# Patient Record
Sex: Male | Born: 1945
Health system: Southern US, Community
[De-identification: ages and names within clinical notes are randomized; demographics above are authoritative.]

## PROBLEM LIST (undated history)

## (undated) DIAGNOSIS — Z87442 Personal history of urinary calculi: Secondary | ICD-10-CM

## (undated) DIAGNOSIS — T7840XA Allergy, unspecified, initial encounter: Secondary | ICD-10-CM

## (undated) HISTORY — DX: Allergy, unspecified, initial encounter: T78.40XA

## (undated) HISTORY — PX: COLONOSCOPY: SHX174

## (undated) HISTORY — PX: JOINT REPLACEMENT: SHX530

## (undated) HISTORY — PX: FINGER SURGERY: SHX640

## (undated) HISTORY — PX: TONSILLECTOMY: SUR1361

---

## 2011-05-30 ENCOUNTER — Telehealth: Payer: Self-pay

## 2011-05-30 NOTE — Telephone Encounter (Signed)
.  [facility] PT WOULD LIKE TO HAVE A REFILL ON HIS GENERIC LIPITOR TO [facility] PLEASE CALL PT AT [phone]    [facility]

## 2011-05-31 MED ORDER — ATORVASTATIN CALCIUM 10 MG PO TABS: 10.0000 mg | ORAL_TABLET | Freq: Every day | ORAL | Status: DC

## 2011-05-31 NOTE — Telephone Encounter (Signed)
Patient notified

## 2011-05-31 NOTE — Telephone Encounter (Signed)
Sent into [facility]  

## 2011-06-05 ENCOUNTER — Other Ambulatory Visit: Payer: Self-pay | Admitting: *Deleted

## 2011-06-05 MED ORDER — ATORVASTATIN CALCIUM 10 MG PO TABS: 10.0000 mg | ORAL_TABLET | Freq: Every day | ORAL | Status: DC

## 2011-06-13 ENCOUNTER — Other Ambulatory Visit: Payer: Self-pay

## 2011-06-13 MED ORDER — ATORVASTATIN CALCIUM 10 MG PO TABS: 10.0000 mg | ORAL_TABLET | Freq: Every day | ORAL | Status: DC

## 2011-11-21 ENCOUNTER — Telehealth: Payer: Self-pay

## 2011-11-21 NOTE — Telephone Encounter (Signed)
Yes I have found it and advised patient I will make sure Dr Neva Seat has it for his appt tomorrow.

## 2011-11-21 NOTE — Telephone Encounter (Signed)
PT CALLED HE HAS AN APPT WITH DR Neva Seat TOMORROW AND WANTS TO MAKE SURE [facility] HAS SENT OVER MEDICAL RECORDS FOR DR Neva Seat TO REVIEW

## 2011-11-22 ENCOUNTER — Ambulatory Visit (INDEPENDENT_AMBULATORY_CARE_PROVIDER_SITE_OTHER): Payer: BC Managed Care – PPO | Admitting: Family Medicine

## 2011-11-22 ENCOUNTER — Encounter: Payer: Self-pay | Admitting: Family Medicine

## 2011-11-22 VITALS — BP 132/80 | HR 76 | Temp 98.1°F | Resp 18 | Ht 69.0 in | Wt 167.0 lb

## 2011-11-22 DIAGNOSIS — Z0000 Encounter for general adult medical examination without abnormal findings: Secondary | ICD-10-CM

## 2011-11-22 DIAGNOSIS — Z8249 Family history of ischemic heart disease and other diseases of the circulatory system: Secondary | ICD-10-CM

## 2011-11-22 DIAGNOSIS — Z23 Encounter for immunization: Secondary | ICD-10-CM

## 2011-11-22 DIAGNOSIS — E785 Hyperlipidemia, unspecified: Secondary | ICD-10-CM

## 2011-11-22 LAB — CBC
HCT: 47.6 % (ref 39.0–52.0)
Hemoglobin: 16.6 g/dL (ref 13.0–17.0)
MCH: 31.7 pg (ref 26.0–34.0)
MCHC: 34.9 g/dL (ref 30.0–36.0)
MCV: 91 fL (ref 78.0–100.0)
Platelets: 261 10*3/uL (ref 150–400)
RBC: 5.23 MIL/uL (ref 4.22–5.81)
RDW: 13.5 % (ref 11.5–15.5)
WBC: 6.6 10*3/uL (ref 4.0–10.5)

## 2011-11-22 MED ORDER — TETANUS-DIPHTH-ACELL PERTUSSIS 5-2.5-18.5 LF-MCG/0.5 IM SUSP: 0.5000 mL | Freq: Once | INTRAMUSCULAR | Status: DC

## 2011-11-22 MED ORDER — ATORVASTATIN CALCIUM 10 MG PO TABS: 10.0000 mg | ORAL_TABLET | Freq: Every day | ORAL | Status: DC

## 2011-11-22 MED ORDER — PNEUMOCOCCAL VAC POLYVALENT 25 MCG/0.5ML IJ INJ: 0.5000 mL | INJECTION | INTRAMUSCULAR | Status: AC

## 2011-11-22 NOTE — Progress Notes (Signed)
Subjective:    Patient ID: Jesse Riddle, male    DOB: [birth year], 66 y.o.   MRN: [id number]  HPI Jesse Riddle is a 66 y.o. male Here for cpe.   Mom and dad passed in 4's.  6 siblings - brother in mid 63's with heart disease - MI (but was smoker). Biking 25-35 miles per weekend, without any chest pain.   Hyperlipidemia - 11/12 labs: TC 148, HDL 46, LDL 87. LFT's WNL. Few missed doses - but usually takes.   Borderline GFR - 74 on 02/08/11 (cr 1.05).   Hx elevated PSA- followed by [facility].  PSA 2.0 06/03/11 at [facility]. May office visit with Dr. Margarita Grizzle - stable for 5 years.  Plan now to follow up PSA here yearly. Had urinalysis earlier this year.   Last Td in approx. 2005. No hx of pneumovax or Zostavax.   Review of Systems  Constitutional: Negative for fatigue and unexpected weight change.  Eyes: Negative for visual disturbance.  Respiratory: Negative for cough, chest tightness and shortness of breath.   Cardiovascular: Negative for chest pain, palpitations and leg swelling.  Gastrointestinal: Negative for abdominal pain and blood in stool.  Neurological: Negative for dizziness, light-headedness and headaches.  few spots on face - appt with dermatologist - in next 1 month.  No known hx of skin ca.  Few red spots noted.  No other concerns on 13 point ROS on PHS.     Objective:   Physical Exam  Constitutional: He is oriented to person, place, and time. He appears well-developed and well-nourished.  HENT:  Head: Normocephalic and atraumatic.  Right Ear: External ear normal.  Left Ear: External ear normal.  Mouth/Throat: Oropharynx is clear and moist.  Eyes: Conjunctivae and EOM are normal. Pupils are equal, round, and reactive to light.  Neck: Normal range of motion. Neck supple. No thyromegaly present.  Cardiovascular: Normal rate, regular rhythm, normal heart sounds and intact distal pulses.   Pulmonary/Chest: Effort normal and breath sounds normal. No respiratory  distress. He has no wheezes.  Abdominal: Soft. He exhibits no distension. There is no tenderness. Hernia confirmed negative in the right inguinal area and confirmed negative in the left inguinal area.  Musculoskeletal: Normal range of motion. He exhibits no edema and no tenderness.  Lymphadenopathy:    He has no cervical adenopathy.  Neurological: He is alert and oriented to person, place, and time. He has normal reflexes.  Skin: Skin is warm and dry.  Psychiatric: He has a normal mood and affect. His behavior is normal.       Assessment & Plan:  Jesse Riddle is a 66 y.o. male 1. Annual physical exam  CBC, Comprehensive metabolic panel, EKG 12-Lead, Lipid panel, TDaP (BOOSTRIX) injection 0.5 mL, pneumococcal 23 valent vaccine (PNU-IMMUNE) injection 0.5 mL  2. Hyperlipidemia  Comprehensive metabolic panel, EKG 12-Lead, Lipid panel, atorvastatin (LIPITOR) 10 MG tablet  3. Family history of coronary artery disease  EKG 12-Lead  4. Need for prophylactic vaccination against Streptococcus pneumoniae (pneumococcus)  pneumococcal 23 valent vaccine (PNU-IMMUNE) injection 0.5 mL  5. Need for prophylactic vaccination with combined diphtheria-tetanus-pertussis (DTP) vaccine  TDaP (BOOSTRIX) injection 0.5 mL    CPE -  Discussed stress testing/cards eval with FH of CAD, age and hyperlipidemia.  He would like to think about this referral - deferred at this point.  Tdap updated, pneumovax given and paper rx for zostavax - for [facility].    Hx of elevated PSA - last reading 2.0  in April - plan on once yearly, then if increasing can refer back to urology.    Hyperlipidemia - prior ok.  Cont fish oil, baby ASA, and current dose of lipitor.

## 2011-11-22 NOTE — Patient Instructions (Addendum)
Your should receive a call or letter about your lab results within the next week to 10 days.  Check into your insurance coverage about stress testing.

## 2011-11-23 LAB — COMPREHENSIVE METABOLIC PANEL
Alkaline Phosphatase: 56 U/L (ref 39–117)
BUN: 10 mg/dL (ref 6–23)
CO2: 24 mEq/L (ref 19–32)
Glucose, Bld: 85 mg/dL (ref 70–99)
Total Bilirubin: 0.7 mg/dL (ref 0.3–1.2)

## 2011-11-23 LAB — LIPID PANEL
Cholesterol: 137 mg/dL (ref 0–200)
HDL: 44 mg/dL (ref >39)
LDL Cholesterol: 79 mg/dL (ref 0–99)
Triglycerides: 71 mg/dL (ref <150)
VLDL: 14 mg/dL (ref 0–40)

## 2011-12-03 ENCOUNTER — Telehealth: Payer: Self-pay

## 2011-12-03 NOTE — Telephone Encounter (Signed)
Patient would like a copy of his lab results from his visit on 11/22/11 mailed to him.

## 2011-12-03 NOTE — Telephone Encounter (Signed)
Mailed to him.  

## 2012-11-23 ENCOUNTER — Other Ambulatory Visit: Payer: Self-pay | Admitting: Family Medicine

## 2012-11-23 DIAGNOSIS — E785 Hyperlipidemia, unspecified: Secondary | ICD-10-CM

## 2012-11-23 MED ORDER — ATORVASTATIN CALCIUM 10 MG PO TABS: 10.0000 mg | ORAL_TABLET | Freq: Every day | ORAL | Status: DC

## 2012-11-23 NOTE — Progress Notes (Signed)
Wife in office. Advised he has appt on 9/22. Ran out of meds few weeks. 90 day supply written.

## 2012-12-21 ENCOUNTER — Ambulatory Visit (INDEPENDENT_AMBULATORY_CARE_PROVIDER_SITE_OTHER): Payer: BC Managed Care – PPO | Admitting: Family Medicine

## 2012-12-21 ENCOUNTER — Encounter: Payer: Self-pay | Admitting: Family Medicine

## 2012-12-21 VITALS — BP 130/82 | HR 68 | Temp 98.0°F | Resp 16 | Ht 69.0 in | Wt 167.4 lb

## 2012-12-21 DIAGNOSIS — Z0000 Encounter for general adult medical examination without abnormal findings: Secondary | ICD-10-CM

## 2012-12-21 DIAGNOSIS — L719 Rosacea, unspecified: Secondary | ICD-10-CM

## 2012-12-21 DIAGNOSIS — Z23 Encounter for immunization: Secondary | ICD-10-CM

## 2012-12-21 DIAGNOSIS — Z87898 Personal history of other specified conditions: Secondary | ICD-10-CM

## 2012-12-21 DIAGNOSIS — E785 Hyperlipidemia, unspecified: Secondary | ICD-10-CM

## 2012-12-21 LAB — CBC WITH DIFFERENTIAL/PLATELET
Basophils Absolute: 0.1 10*3/uL (ref 0.0–0.1)
Basophils Relative: 1 % (ref 0–1)
Eosinophils Relative: 2 % (ref 0–5)
Lymphocytes Relative: 30 % (ref 12–46)
MCHC: 35.3 g/dL (ref 30.0–36.0)
MCV: 89.8 fL (ref 78.0–100.0)
Platelets: 270 10*3/uL (ref 150–400)
RDW: 13.4 % (ref 11.5–15.5)
WBC: 7.1 10*3/uL (ref 4.0–10.5)

## 2012-12-21 LAB — POCT URINALYSIS DIPSTICK
Leukocytes, UA: NEGATIVE
Nitrite, UA: NEGATIVE
Protein, UA: NEGATIVE
pH, UA: 5.5

## 2012-12-21 LAB — POCT UA - MICROSCOPIC ONLY
Crystals, Ur, HPF, POC: NEGATIVE
Yeast, UA: NEGATIVE

## 2012-12-21 MED ORDER — ZOSTER VACCINE LIVE 19400 UNT/0.65ML ~~LOC~~ SOLR: 0.6500 mL | Freq: Once | SUBCUTANEOUS | Status: DC

## 2012-12-21 MED ORDER — ATORVASTATIN CALCIUM 10 MG PO TABS: 10.0000 mg | ORAL_TABLET | Freq: Every day | ORAL | Status: DC

## 2012-12-21 NOTE — Patient Instructions (Signed)
You should receive a call or letter about your lab results within the next week to 10 days.  To look up more info on your condition, go to the website [email or web address], then on patient resources - select [facility]. Under patient resources, select rosacea Rosacea Rosacea is a long-term (chronic) condition that affects the skin of the face (cheeks, nose, brow, and chin) and sometimes the eyes. Rosacea causes the blood vessels near the surface of the skin to enlarge, resulting in redness. This condition usually begins after age 51. It occurs most often in light-skinned women. Without treatment, rosacea tends to get worse over time. There is no cure for rosacea, but treatment can help control your symptoms. CAUSES  The cause is unknown. It is thought that some people may inherit a tendency to develop rosacea. Certain triggers can make your rosacea worse, including:  Hot baths.  Exercise.  Sunlight.  Very hot or cold temperatures.  Hot or spicy foods and drinks.  Drinking alcohol.  Stress.  Taking blood pressure medicine.  Long-term use of topical steroids on the face. SYMPTOMS   Redness of the face.  Red bumps or pimples on the face.  Red, enlarged nose (rhinophyma).  Blushing easily.  Red lines on the skin.  Irritated or burning feeling in the eyes.  Swollen eyelids. DIAGNOSIS  Your caregiver can usually tell what is wrong by asking about your symptoms and performing a physical exam. TREATMENT  Avoiding triggers is an important part of treatment. You will also need to see a skin specialist (dermatologist) who can develop a treatment plan for you. The goals of treatment are to control your condition and to improve the appearance of your skin. It may take several weeks or months of treatment before you notice an improvement in your skin. Even after your skin improves, you will likely need to continue treatment to prevent your rosacea from coming back. Treatment methods may  include:  Using sunscreen or sunblock daily to protect the skin.  Antibiotic medicine, such as metronidazole, applied directly to the skin.  Antibiotics taken by mouth. This is usually prescribed if you have eye problems from your rosacea.  Laser surgery to improve the appearance of the skin. This surgery can reduce the appearance of red lines on the skin and can remove excess tissue from the nose to reduce its size. HOME CARE INSTRUCTIONS  Avoid things that seem to trigger your flare-ups.  If you are given antibiotics, take them as directed. Finish them even if you start to feel better.  Use a gentle facial cleanser that does not contain alcohol.  You may use a mild facial moisturizer.  Use a sunscreen or sunblock with SPF 30 or greater.  Wear a green-tinted foundation powder to conceal redness, if needed. Choose cosmetics that are noncomedogenic. This means they do not block your pores.  If your eyelids are affected, apply warm compresses to the eyelids. Do this up to 4 times a day or as directed by your caregiver. SEEK MEDICAL CARE IF:  Your skin problems get worse.  You feel depressed.  You lose your appetite.  You have trouble concentrating.  You have problems with your eyes, such as redness or itching. MAKE SURE YOU:  Understand these instructions.  Will watch your condition.  Will get help right away if you are not doing well or get worse. Document Released: 04/25/2004 Document Revised: 09/17/2011 Document Reviewed: 02/26/2011 [facility] Patient Information 2014 [place], [place].  Keeping you healthy  Get these tests  Blood pressure- Have your blood pressure checked once a year by your healthcare provider.  Normal blood pressure is 120/80  Weight- Have your body mass index (BMI) calculated to screen for obesity.  BMI is a measure of body fat based on height and weight. You can also calculate your own BMI at [email or web address].  Cholesterol- Have your  cholesterol checked every year.  Diabetes- Have your blood sugar checked regularly if you have high blood pressure, high cholesterol, have a family history of diabetes or if you are overweight.  Screening for Colon Cancer- Colonoscopy starting at age 26.  Screening may begin sooner depending on your family history and other health conditions. Follow up colonoscopy as directed by your Gastroenterologist.  Screening for Prostate Cancer- Both blood work (PSA) and a rectal exam help screen for Prostate Cancer.  Screening begins at age 74 with African-American men and at age 55 with Caucasian men.  Screening may begin sooner depending on your family history.  Take these medicines  Aspirin- One aspirin daily can help prevent Heart disease and Stroke.  Flu shot- Every fall.  Tetanus- Every 10 years.  Zostavax- Once after the age of 46 to prevent Shingles.  Pneumonia shot- Once after the age of 20; if you are younger than 87, ask your healthcare provider if you need a Pneumonia shot.  Take these steps  Don't smoke- If you do smoke, talk to your doctor about quitting.  For tips on how to quit, go to www.smokefree.gov or call 1-800-QUIT-NOW.  Be physically active- Exercise 5 days a week for at least 30 minutes.  If you are not already physically active start slow and gradually work up to 30 minutes of moderate physical activity.  Examples of moderate activity include walking briskly, mowing the yard, dancing, swimming, bicycling, etc.  Eat a healthy diet- Eat a variety of healthy food such as fruits, vegetables, low fat milk, low fat cheese, yogurt, lean meant, poultry, fish, beans, tofu, etc. For more information go to www.thenutritionsource.org  Drink alcohol in moderation- Limit alcohol intake to less than two drinks a day. Never drink and drive.  Dentist- Brush and floss twice daily; visit your dentist twice a year.  Depression- Your emotional health is as important as your physical health.  If you're feeling down, or losing interest in things you would normally enjoy please talk to your healthcare provider.  Eye exam- Visit your eye doctor every year.  Safe sex- If you may be exposed to a sexually transmitted infection, use a condom.  Seat belts- Seat belts can save your life; always wear one.  Smoke/Carbon Monoxide detectors- These detectors need to be installed on the appropriate level of your home.  Replace batteries at least once a year.  Skin cancer- When out in the sun, cover up and use sunscreen 15 SPF or higher.  Violence- If anyone is threatening you, please tell your healthcare provider.  Living Will/ Health care power of attorney- Speak with your healthcare provider and family.

## 2012-12-21 NOTE — Progress Notes (Signed)
Subjective:    Patient ID: Jesse Riddle, male    DOB: October 05, 1945, 67 y.o.   MRN: [id number]  HPI Chrsitopher Riddle is a 67 y.o. male Here for annual exam.  Colonoscopy: 2009 - repeat in 10 years. Tdap 10/2011.  PSA: as below Dentist: Optho: last seen in past 6 months, hx of cataract surgery with lens in R eye few years ago. Early cataract on L.  Advanced directives. Has living will, and has discussed with children. . Full code.  Pneumovax: 10/2011 Zostavax: Rx given 10/2011. Needs another rx today.  Flu - plans on having done at time of zostavax at [facility].  EKG: 10/2011 - SR,  RSR' in V1, but stable.   No acute concerns today.   Hyperlipidemia - on Lipitor 10mg  qd. Fish oil, ASA 81mg  qd.  Results for orders placed in visit on 11/22/11  CBC      Result Value Range   WBC 6.6  4.0 - 10.5 K/uL   RBC 5.23  4.22 - 5.81 MIL/uL   Hemoglobin 16.6  13.0 - 17.0 g/dL   HCT 30.8  65.7 - 84.6 %   MCV 91.0  78.0 - 100.0 fL   MCH 31.7  26.0 - 34.0 pg   MCHC 34.9  30.0 - 36.0 g/dL   RDW 96.2  95.2 - 84.1 %   Platelets 261  150 - 400 K/uL  COMPREHENSIVE METABOLIC PANEL      Result Value Range   Sodium 139  135 - 145 mEq/L   Potassium 4.2  3.5 - 5.3 mEq/L   Chloride 106  96 - 112 mEq/L   CO2 24  19 - 32 mEq/L   Glucose, Bld 85  70 - 99 mg/dL   BUN 10  6 - 23 mg/dL   Creat 3.24  4.01 - 0.27 mg/dL   Total Bilirubin 0.7  0.3 - 1.2 mg/dL   Alkaline Phosphatase 56  39 - 117 U/L   AST 27  0 - 37 U/L   ALT 18  0 - 53 U/L   Total Protein 6.4  6.0 - 8.3 g/dL   Albumin 3.9  3.5 - 5.2 g/dL   Calcium 9.0  8.4 - 25.3 mg/dL  LIPID PANEL      Result Value Range   Cholesterol 137  0 - 200 mg/dL   Triglycerides 71  <664 mg/dL   HDL 44  >40 mg/dL   Total CHOL/HDL Ratio 3.1     VLDL 14  0 - 40 mg/dL   LDL Cholesterol 79  0 - 99 mg/dL   Mom and dad passed in 7's.  6 siblings - brother in mid 53's with heart disease - MI (but was smoker and drinker), brother passed in March of this year with cancer in bone of  some type. Bikes up to 15-20 miles per weekend, without any chest pain, or new dyspnea/fatigue.  Discussed stress testing/cards eval with FH of CAD, age and hyperlipidemia at 10/2011 physical. Deferred at that point.   Hx elevated PSA- followed by [facility].  PSA 2.0 06/03/11 at [facility]. May 2013 office visit with Dr. Margarita Grizzle - stable for 5 years.  Plan to follow up PSA here yearly. No new urinary sx's.  Nocturia up to 3 times - stable for years. No hematuria. Min hesitancy, slight weak flow - but stable.    Review of Systems 13 point review of systems per patient health survey noted.  Negative other than as indicated on reviewed  nursing note.      Objective:   Physical Exam  Vitals reviewed. Constitutional: He is oriented to person, place, and time. He appears well-developed and well-nourished.  HENT:  Head: Normocephalic and atraumatic.  Right Ear: External ear normal.  Left Ear: External ear normal.  Mouth/Throat: Oropharynx is clear and moist.  Eyes: Conjunctivae and EOM are normal. Pupils are equal, round, and reactive to light.  Neck: Normal range of motion. Neck supple. No thyromegaly present.  Cardiovascular: Normal rate, regular rhythm, normal heart sounds and intact distal pulses.   Pulmonary/Chest: Effort normal and breath sounds normal. No respiratory distress. He has no wheezes.  Abdominal: Soft. He exhibits no distension. There is no tenderness. Hernia confirmed negative in the right inguinal area and confirmed negative in the left inguinal area.  Genitourinary: Prostate is enlarged (slight enlargement without apparent focal nodularity. ). Prostate is not tender.  Musculoskeletal: Normal range of motion. He exhibits no edema and no tenderness.  Lymphadenopathy:    He has no cervical adenopathy.  Neurological: He is alert and oriented to person, place, and time. He has normal reflexes.  Skin: Skin is warm and dry.     Psychiatric: He has a normal mood and  affect. His behavior is normal.   Filed Vitals:   12/21/12 1312  BP: 130/82  Pulse: 68  Temp: 98 F (36.7 C)  TempSrc: Oral  Resp: 16  Height: 5\' 9"  (1.753 m)  Weight: 167 lb 6.4 oz (75.932 kg)  SpO2: 95%   Vision noted: 20/25OD, 20/50 OS, 20/25 OU.  Results for orders placed in visit on 12/21/12  IFOBT (OCCULT BLOOD)      Result Value Range   IFOBT Negative    POCT UA - MICROSCOPIC ONLY      Result Value Range   WBC, Ur, HPF, POC 0-2     RBC, urine, microscopic 0-2     Bacteria, U Microscopic neg     Mucus, UA large     Epithelial cells, urine per micros 0-3     Crystals, Ur, HPF, POC neg     Casts, Ur, LPF, POC neg     Yeast, UA neg    POCT URINALYSIS DIPSTICK      Result Value Range   Color, UA yellow     Clarity, UA clear     Glucose, UA neg     Bilirubin, UA neg     Ketones, UA neg     Spec Grav, UA 1.025     Blood, UA neg     pH, UA 5.5     Protein, UA neg     Urobilinogen, UA 0.2     Nitrite, UA neg     Leukocytes, UA Negative         Assessment & Plan:  Jesse Riddle is a 67 y.o. male Annual physical exam - Plan: IFOBT POC (occult bld, rslt in office), POCT UA - Microscopic Only, POCT urinalysis dipstick, Comprehensive metabolic panel, CBC with Differential  History of elevated PSA - Plan: PSA  Rosacea  Hyperlipidemia - Plan: atorvastatin (LIPITOR) 10 MG tablet, Comprehensive metabolic panel, Lipid panel  Need for shingles vaccine - Plan: zoster vaccine live, PF, (ZOSTAVAX) 78295 UNT/0.65ML injection CPE - up to date on routine care as above. Plans on zostavax and flu vaccines at pharmacy.   Hx of elevated PSA - suspect BPH. recheck PSA today.   Rosacea - Did note reddened area/dry skin L face at end of visit.  Hx of rosacea in past. current sx's for week or two.  Trigger avoidance discussed, h/o given. sunblock, hydrating lotion, and if not improved in next few weeks - RTC to discuss other treatments.  Hyperlipidemia - prior controlled. Labs  pending.  Refilled lipitor.   FH of MI, other cardiac RF's of hyperlipidemia and age, but declined cards eval at present.  Plans on discussing this again next year. RTC/er precautions.   Meds ordered this encounter  Medications  . zoster vaccine live, PF, (ZOSTAVAX) 13086 UNT/0.65ML injection    Sig: Inject 19,400 Units into the skin once.    Dispense:  1 each    Refill:  0  . atorvastatin (LIPITOR) 10 MG tablet    Sig: Take 1 tablet (10 mg total) by mouth daily.    Dispense:  90 tablet    Refill:  2   Patient Instructions  You should receive a call or letter about your lab results within the next week to 10 days.  To look up more info on your condition, go to the website [email or web address], then on patient resources - select [facility]. Under patient resources, select rosacea Rosacea Rosacea is a long-term (chronic) condition that affects the skin of the face (cheeks, nose, brow, and chin) and sometimes the eyes. Rosacea causes the blood vessels near the surface of the skin to enlarge, resulting in redness. This condition usually begins after age 19. It occurs most often in light-skinned women. Without treatment, rosacea tends to get worse over time. There is no cure for rosacea, but treatment can help control your symptoms. CAUSES  The cause is unknown. It is thought that some people may inherit a tendency to develop rosacea. Certain triggers can make your rosacea worse, including:  Hot baths.  Exercise.  Sunlight.  Very hot or cold temperatures.  Hot or spicy foods and drinks.  Drinking alcohol.  Stress.  Taking blood pressure medicine.  Long-term use of topical steroids on the face. SYMPTOMS   Redness of the face.  Red bumps or pimples on the face.  Red, enlarged nose (rhinophyma).  Blushing easily.  Red lines on the skin.  Irritated or burning feeling in the eyes.  Swollen eyelids. DIAGNOSIS  Your caregiver can usually tell what is wrong by asking about your  symptoms and performing a physical exam. TREATMENT  Avoiding triggers is an important part of treatment. You will also need to see a skin specialist (dermatologist) who can develop a treatment plan for you. The goals of treatment are to control your condition and to improve the appearance of your skin. It may take several weeks or months of treatment before you notice an improvement in your skin. Even after your skin improves, you will likely need to continue treatment to prevent your rosacea from coming back. Treatment methods may include:  Using sunscreen or sunblock daily to protect the skin.  Antibiotic medicine, such as metronidazole, applied directly to the skin.  Antibiotics taken by mouth. This is usually prescribed if you have eye problems from your rosacea.  Laser surgery to improve the appearance of the skin. This surgery can reduce the appearance of red lines on the skin and can remove excess tissue from the nose to reduce its size. HOME CARE INSTRUCTIONS  Avoid things that seem to trigger your flare-ups.  If you are given antibiotics, take them as directed. Finish them even if you start to feel better.  Use a gentle facial cleanser that does not contain alcohol.  You  may use a mild facial moisturizer.  Use a sunscreen or sunblock with SPF 30 or greater.  Wear a green-tinted foundation powder to conceal redness, if needed. Choose cosmetics that are noncomedogenic. This means they do not block your pores.  If your eyelids are affected, apply warm compresses to the eyelids. Do this up to 4 times a day or as directed by your caregiver. SEEK MEDICAL CARE IF:  Your skin problems get worse.  You feel depressed.  You lose your appetite.  You have trouble concentrating.  You have problems with your eyes, such as redness or itching. MAKE SURE YOU:  Understand these instructions.  Will watch your condition.  Will get help right away if you are not doing well or get  worse. Document Released: 04/25/2004 Document Revised: 09/17/2011 Document Reviewed: 02/26/2011 [facility] Patient Information 2014 [place], [place].  Keeping you healthy  Get these tests  Blood pressure- Have your blood pressure checked once a year by your healthcare provider.  Normal blood pressure is 120/80  Weight- Have your body mass index (BMI) calculated to screen for obesity.  BMI is a measure of body fat based on height and weight. You can also calculate your own BMI at [email or web address].  Cholesterol- Have your cholesterol checked every year.  Diabetes- Have your blood sugar checked regularly if you have high blood pressure, high cholesterol, have a family history of diabetes or if you are overweight.  Screening for Colon Cancer- Colonoscopy starting at age 26.  Screening may begin sooner depending on your family history and other health conditions. Follow up colonoscopy as directed by your Gastroenterologist.  Screening for Prostate Cancer- Both blood work (PSA) and a rectal exam help screen for Prostate Cancer.  Screening begins at age 69 with African-American men and at age 46 with Caucasian men.  Screening may begin sooner depending on your family history.  Take these medicines  Aspirin- One aspirin daily can help prevent Heart disease and Stroke.  Flu shot- Every fall.  Tetanus- Every 10 years.  Zostavax- Once after the age of 46 to prevent Shingles.  Pneumonia shot- Once after the age of 14; if you are younger than 62, ask your healthcare provider if you need a Pneumonia shot.  Take these steps  Don't smoke- If you do smoke, talk to your doctor about quitting.  For tips on how to quit, go to www.smokefree.gov or call 1-800-QUIT-NOW.  Be physically active- Exercise 5 days a week for at least 30 minutes.  If you are not already physically active start slow and gradually work up to 30 minutes of moderate physical activity.  Examples of moderate activity include  walking briskly, mowing the yard, dancing, swimming, bicycling, etc.  Eat a healthy diet- Eat a variety of healthy food such as fruits, vegetables, low fat milk, low fat cheese, yogurt, lean meant, poultry, fish, beans, tofu, etc. For more information go to www.thenutritionsource.org  Drink alcohol in moderation- Limit alcohol intake to less than two drinks a day. Never drink and drive.  Dentist- Brush and floss twice daily; visit your dentist twice a year.  Depression- Your emotional health is as important as your physical health. If you're feeling down, or losing interest in things you would normally enjoy please talk to your healthcare provider.  Eye exam- Visit your eye doctor every year.  Safe sex- If you may be exposed to a sexually transmitted infection, use a condom.  Seat belts- Seat belts can save your life; always wear one.  Smoke/Carbon Monoxide detectors- These detectors need to be installed on the appropriate level of your home.  Replace batteries at least once a year.  Skin cancer- When out in the sun, cover up and use sunscreen 15 SPF or higher.  Violence- If anyone is threatening you, please tell your healthcare provider.  Living Will/ Health care power of attorney- Speak with your healthcare provider and family.

## 2012-12-21 NOTE — Progress Notes (Signed)
  Subjective:    Patient ID: Jesse Riddle, male    DOB: [birth year], 67 y.o.   MRN: [id number]  HPI    Review of Systems  Constitutional: Negative.   HENT: Negative.   Eyes: Negative.   Respiratory: Negative.   Cardiovascular: Negative.   Gastrointestinal: Negative.   Endocrine: Negative.   Genitourinary: Negative.   Allergic/Immunologic: Negative.   Neurological: Negative.   Hematological: Negative.   Psychiatric/Behavioral: Negative.        Objective:   Physical Exam        Assessment & Plan:

## 2012-12-22 LAB — COMPREHENSIVE METABOLIC PANEL
ALT: 20 U/L (ref 0–53)
AST: 29 U/L (ref 0–37)
Creat: 1.06 mg/dL (ref 0.50–1.35)
Total Bilirubin: 0.8 mg/dL (ref 0.3–1.2)

## 2012-12-22 LAB — LIPID PANEL
Cholesterol: 141 mg/dL (ref 0–200)
Total CHOL/HDL Ratio: 2.9 Ratio
VLDL: 11 mg/dL (ref 0–40)

## 2012-12-22 LAB — PSA: PSA: 2.51 ng/mL (ref <=4.00)

## 2012-12-23 ENCOUNTER — Encounter: Payer: Self-pay | Admitting: Family Medicine

## 2013-10-07 ENCOUNTER — Telehealth: Payer: Self-pay

## 2013-10-07 DIAGNOSIS — E785 Hyperlipidemia, unspecified: Secondary | ICD-10-CM

## 2013-10-07 NOTE — Telephone Encounter (Signed)
CHANGE IN PHARMACY LOCATION  [facility] is the new  Place to send medication refills for atorvastatin (LIPITOR) 10 MG tablet Please call in 90 day supply   Pharmacy Phone [phone]    Patient Jesse Riddle  [phone]

## 2013-10-21 NOTE — Telephone Encounter (Signed)
Patient called back this morning. He has contacted [facility] and they have not received anything from our office regarding his atorvastatin rx. It looks like his message from last week was taken but not forwarded to the clinical staff. Patient took is last pill yesterday and is completely out. Please send refills to [facility]. Their phone number is [phone]. Says this is a better number than the one he left last week. Please call when rx has been sent. Cb# [phone].

## 2013-10-21 NOTE — Telephone Encounter (Signed)
pt has appt scheduled for 12/27/13 but hasn't been in since last Sept. Do you want to give him a 90 day RF?

## 2013-10-22 MED ORDER — ATORVASTATIN CALCIUM 10 MG PO TABS: 10.0000 mg | ORAL_TABLET | Freq: Every day | ORAL | Status: DC

## 2013-10-22 NOTE — Telephone Encounter (Signed)
Rx sent. He needs OV and fasting labs for the next fill. I see he has an appointment 12/27/2013.

## 2013-10-22 NOTE — Telephone Encounter (Signed)
Resent prescription to [facility]. Pt is aware

## 2013-10-22 NOTE — Telephone Encounter (Signed)
Patient is upset because he has been trying for a week to get his rx filled. He first called on July 9 and his message was never routed to the clinical staff. Now he is out of his medication. I told him that his message from yesterday has been routed and marked high priority and he wants a call back from the nurse today. Please call back at [phone].

## 2013-11-16 LAB — HM COLONOSCOPY

## 2013-12-27 ENCOUNTER — Encounter: Payer: Self-pay | Admitting: Family Medicine

## 2013-12-27 ENCOUNTER — Ambulatory Visit (INDEPENDENT_AMBULATORY_CARE_PROVIDER_SITE_OTHER): Payer: Medicare Other | Admitting: Family Medicine

## 2013-12-27 VITALS — BP 130/80 | HR 73 | Temp 98.0°F | Resp 16 | Ht 69.5 in | Wt 168.6 lb

## 2013-12-27 DIAGNOSIS — Z23 Encounter for immunization: Secondary | ICD-10-CM

## 2013-12-27 DIAGNOSIS — Z125 Encounter for screening for malignant neoplasm of prostate: Secondary | ICD-10-CM

## 2013-12-27 DIAGNOSIS — E785 Hyperlipidemia, unspecified: Secondary | ICD-10-CM

## 2013-12-27 DIAGNOSIS — Z0000 Encounter for general adult medical examination without abnormal findings: Secondary | ICD-10-CM

## 2013-12-27 DIAGNOSIS — Z8249 Family history of ischemic heart disease and other diseases of the circulatory system: Secondary | ICD-10-CM

## 2013-12-27 DIAGNOSIS — E782 Mixed hyperlipidemia: Secondary | ICD-10-CM

## 2013-12-27 LAB — COMPLETE METABOLIC PANEL WITHOUT GFR
ALT: 19 U/L (ref 0–53)
AST: 29 U/L (ref 0–37)
Albumin: 4 g/dL (ref 3.5–5.2)
Alkaline Phosphatase: 60 U/L (ref 39–117)
BUN: 12 mg/dL (ref 6–23)
CO2: 25 meq/L (ref 19–32)
Calcium: 8.9 mg/dL (ref 8.4–10.5)
Chloride: 107 meq/L (ref 96–112)
Creat: 1.01 mg/dL (ref 0.50–1.35)
GFR, Est African American: 87 mL/min
GFR, Est Non African American: 76 mL/min
Glucose, Bld: 92 mg/dL (ref 70–99)
Potassium: 4.4 meq/L (ref 3.5–5.3)
Sodium: 141 meq/L (ref 135–145)
Total Bilirubin: 0.5 mg/dL (ref 0.2–1.2)
Total Protein: 6.3 g/dL (ref 6.0–8.3)

## 2013-12-27 LAB — CBC WITH DIFFERENTIAL/PLATELET
Basophils Absolute: 0.1 K/uL (ref 0.0–0.1)
Basophils Relative: 1 % (ref 0–1)
Eosinophils Absolute: 0.1 K/uL (ref 0.0–0.7)
Eosinophils Relative: 2 % (ref 0–5)
HCT: 48.3 % (ref 39.0–52.0)
Hemoglobin: 16.6 g/dL (ref 13.0–17.0)
Lymphocytes Relative: 25 % (ref 12–46)
Lymphs Abs: 1.9 K/uL (ref 0.7–4.0)
MCH: 31.8 pg (ref 26.0–34.0)
MCHC: 34.4 g/dL (ref 30.0–36.0)
MCV: 92.5 fL (ref 78.0–100.0)
Monocytes Absolute: 0.4 K/uL (ref 0.1–1.0)
Monocytes Relative: 6 % (ref 3–12)
Neutro Abs: 4.9 K/uL (ref 1.7–7.7)
Neutrophils Relative %: 66 % (ref 43–77)
Platelets: 296 K/uL (ref 150–400)
RBC: 5.22 MIL/uL (ref 4.22–5.81)
RDW: 14.1 % (ref 11.5–15.5)
WBC: 7.4 K/uL (ref 4.0–10.5)

## 2013-12-27 LAB — LIPID PANEL
CHOL/HDL RATIO: 2.8 ratio
CHOLESTEROL: 130 mg/dL (ref 0–200)
HDL: 47 mg/dL (ref >39)
LDL Cholesterol: 72 mg/dL (ref 0–99)
TRIGLYCERIDES: 54 mg/dL (ref <150)
VLDL: 11 mg/dL (ref 0–40)

## 2013-12-27 MED ORDER — ATORVASTATIN CALCIUM 10 MG PO TABS: 10.0000 mg | ORAL_TABLET | Freq: Every day | ORAL | Status: DC

## 2013-12-27 NOTE — Progress Notes (Deleted)
Colonoscopy:*** Prostate cancer screening/last PSA: *** Tetanus/Tdap: *** Flu vaccine *** Zostavax: *** Pneumovax: *** Dentist: *** Optho/eye care eval: *** Advanced Directives: ***

## 2013-12-27 NOTE — Progress Notes (Signed)
   Subjective:    Patient ID: Jesse Riddle, male    DOB: 19-Feb-1946, 68 y.o.   MRN: [id number]  HPI    Review of Systems  Constitutional: Negative.   HENT: Negative.   Eyes: Negative.   Respiratory: Negative.   Cardiovascular: Negative.   Gastrointestinal: Negative.   Endocrine: Negative.   Genitourinary: Negative.   Musculoskeletal: Negative.   Skin: Negative.   Allergic/Immunologic: Negative.   Neurological: Negative.   Hematological: Negative.   Psychiatric/Behavioral: Negative.        Objective:   Physical Exam        Assessment & Plan:

## 2013-12-27 NOTE — Progress Notes (Addendum)
Subjective:   Patient ID: Jesse Riddle, male    DOB: [birth year], 68 y.o.   MRN: [id number] This chart was scribed for Jesse Agreste, MD by Cathie Hoops, ED Scribe. The patient was seen in Room 24. The patient's care was started at 1:26 PM.    12/27/2013  Chief Complaint  Patient presents with  . Annual Exam     HPI HPI Comments: Jesse Riddle is a 68 y.o. male who presents to the [facility] here for annual exam. Pt notes he is doing very well.  Here for annual exam. 1.) Colon Cancer Screening His last colonoscopy was August 18. There was one 4 mm polyp in the sigmoid colon, resected. Repeat in 5 years. He reports he had a colonoscopy about 3 weeks ago. Pt notes his colonoscopy had a small benign polyp and was encouraged to return in 5 years. Pt denies family hx of colon cancer.   2.) Prostate Cancer Screening He had a normal PSA at 2.51 last year. Pt notes he has nocutria 2-3x/night which has been constant for the past 5 years. Pt denies dribbling or stopping stream during urination. He denies family hx of prostate cancer. He denies any issues with urination. Pt notes he takes [employer] for hx of elevated PSA-most recent was 2.51. No change in urinary symptoms as above.   3.) Immunizations: Tdap- August 2013 Pneumovax- August 2013 Zostavax- November 2014 Received flu shot today.  4.) Dentist He reports going to dentist recently and notes he goes to the dentist every 6 months. His most recent visit had no abnormal findings.  5.) Eye Care He had a cataract removed and also has an artifical lens in his right eye. Pt denies anything of concern.  6.) Exercise Pt reports he exercises 3-4x/week and notes he exercises about 150 minutes per week. Pt bikes 20-25 miles/week and also walks with no chest pain, dizziness, or light-headedness.   7.) Advanced Directives Pt states he has living wills and healthcare power attorney in place.   Fall and Depression  screening both negative per screening questions.   8.) Hyperlipidemia  Takes aspirin 81 mg q.d and Lipitor 10 mg q.d. Has also taken fish oil in the past. Lipids controlled one year ago. LFTs also normal at that time.  Lab Results  Component Value Date   CHOL 141 12/21/2012   HDL 49 12/21/2012   LDLCALC 81 12/21/2012   TRIG 53 12/21/2012   CHOLHDL 2.9 12/21/2012   9.) Family History of Heart Disease His last EKG was in 2013 without any significant changes from prior. Pt's younger brother was diagnosed heart disease in his mid-50s but pt attributes this to his brother's smoking 2 packs/day. Pt denies wanting a heart stress test at this time and would like to discuss this at his next visit. Pt also denies EKG at this time and would like to discuss this with his insurance. Pt notes he takes CoQ10.   Review of Systems  All other systems reviewed and are negative. 13.ros reviewed as negative other than above.  There are no active problems to display for this patient.  History reviewed. No pertinent past medical history. History reviewed. No pertinent past surgical history. Allergies  Allergen Reactions  . Penicillins Other (See Comments)    child   Prior to Admission medications   Medication Sig Start Date End Date Taking? Authorizing Provider  aspirin 81 MG tablet Take 81 mg by mouth daily.   Yes  Historical Provider, MD  atorvastatin (LIPITOR) 10 MG tablet Take 1 tablet (10 mg total) by mouth daily. Need office visit & fasting labs for additional refills. 10/22/13 10/22/14 Yes Chelle S Jeffery, PA-C  fish oil-omega-3 fatty acids 1000 MG capsule Take 1 g by mouth daily.   Yes Historical Provider, MD  [place] taking daily   Yes Historical Provider, MD  zoster vaccine live, PF, (ZOSTAVAX) 16109 UNT/0.65ML injection Inject 19,400 Units into the skin once. 12/21/12  Yes Jesse Agreste, MD   History   Social History  . Marital Status: Married    Spouse Name:  N/A    Number of Children: N/A  . Years of Education: N/A   Occupational History  . Retired    Social History Main Topics  . Smoking status: Never Smoker   . Smokeless tobacco: Not on file  . Alcohol Use: Yes     Comment: 2  . Drug Use: No  . Sexual Activity: Yes   Other Topics Concern  . Not on file   Social History Narrative   Married. Education: [employer]. Exercise: walk/bike 3 times a week for 30 minutes.   Objective:  Physical Exam  Vitals reviewed. Constitutional: He is oriented to person, place, and time. He appears well-developed and well-nourished.  HENT:  Head: Normocephalic and atraumatic.  Right Ear: External ear normal.  Left Ear: External ear normal.  Mouth/Throat: Oropharynx is clear and moist.  Eyes: Conjunctivae and EOM are normal. Pupils are equal, round, and reactive to light.  Neck: Normal range of motion. Neck supple. No thyromegaly present.  Cardiovascular: Normal rate, regular rhythm, normal heart sounds and intact distal pulses.   Pulmonary/Chest: Effort normal and breath sounds normal. No respiratory distress. He has no wheezes.  Abdominal: Soft. He exhibits no distension. There is no tenderness. Hernia confirmed negative in the right inguinal area and confirmed negative in the left inguinal area.  Genitourinary: Prostate normal. Prostate is not tender.  Prostate firm, no apparent nodularity. Nontender.   Musculoskeletal: Normal range of motion. He exhibits no edema and no tenderness.  Lymphadenopathy:    He has no cervical adenopathy.  Neurological: He is alert and oriented to person, place, and time. He has normal reflexes.  Skin: Skin is warm and dry.  Psychiatric: He has a normal mood and affect. His behavior is normal.    Filed Vitals:   12/27/13 1304  BP: 130/80  Pulse: 73  Temp: 98 F (36.7 C)  TempSrc: Oral  Resp: 16  Height: 5' 9.5" (1.765 m)  Weight: 168 lb 9.6 oz (76.476 kg)  SpO2: 97%    Visual Acuity Screening   Right eye  Left eye Both eyes  Without correction: 20/25 20/30 20/25   With correction:       Assessment & Plan:  1:42 PM- Patient informed of current plan for treatment and evaluation and agrees with plan at this time.  [name] is a 68 y.o. male Routine general medical examination at a health care facility  --anticipatory guidance as below in AVS, screening labs above. Health maintenance items as above in HPI discussed/recommended as applicable.   Need for prophylactic vaccination and inoculation against influenza - Plan: Flu Vaccine QUAD 36+ mos IM given.   Special screening for malignant neoplasm of prostate - Plan: COMPLETE METABOLIC PANEL WITH GFR  -We discussed pros and cons of prostate cancer screening, and after this discussion, he chose to have screening done. PSA obtained, and no  concerning findings on DRE. If psa elevates, then recheck with urology.   Mixed hyperlipidemia - Plan: Lipid panel obtained, continue Lipitor 10 mg qd. Refilled.   Family history of early CAD - in brother, but lifestyle risk factors were different. asx at present. Declined screening EKG today - will consider next year, and also declined cards eval for stress testing. RTC/ER precautions given.    Need for prophylactic vaccination against Streptococcus pneumoniae (pneumococcus) - Plan: Pneumococcal conjugate vaccine 13-valent IM - Prevnar given.    Meds ordered this encounter  Medications  . OVER THE COUNTER MEDICATION    Sig: [place] taking daily  . atorvastatin (LIPITOR) 10 MG tablet    Sig: Take 1 tablet (10 mg total) by mouth daily.    Dispense:  90 tablet    Refill:  3   Patient Instructions  No changes in meds this point.  You should receive a call or letter about your lab results within the next week to 10 days.   Keeping you healthy  Get these tests  Blood pressure- Have your blood pressure checked once a year by your healthcare provider.  Normal blood pressure is 120/80  Weight-  Have your body mass index (BMI) calculated to screen for obesity.  BMI is a measure of body fat based on height and weight. You can also calculate your own BMI at [email or web address].  Cholesterol- Have your cholesterol checked every year.  Diabetes- Have your blood sugar checked regularly if you have high blood pressure, high cholesterol, have a family history of diabetes or if you are overweight.  Screening for Colon Cancer- Colonoscopy starting at age 22.  Screening may begin sooner depending on your family history and other health conditions. Follow up colonoscopy as directed by your Gastroenterologist.  Screening for Prostate Cancer- Both blood work (PSA) and a rectal exam help screen for Prostate Cancer.  Screening begins at age 15 with African-American men and at age 65 with Caucasian men.  Screening may begin sooner depending on your family history.  Take these medicines  Aspirin- One aspirin daily can help prevent Heart disease and Stroke.  Flu shot- Every fall.  Tetanus- Every 10 years.  Zostavax- Once after the age of 82 to prevent Shingles.  Pneumonia shot- Once after the age of 9; if you are younger than 101, ask your healthcare provider if you need a Pneumonia shot.  Take these steps  Don't smoke- If you do smoke, talk to your doctor about quitting.  For tips on how to quit, go to www.smokefree.gov or call 1-800-QUIT-NOW.  Be physically active- Exercise 5 days a week for at least 30 minutes.  If you are not already physically active start slow and gradually work up to 30 minutes of moderate physical activity.  Examples of moderate activity include walking briskly, mowing the yard, dancing, swimming, bicycling, etc.  Eat a healthy diet- Eat a variety of healthy food such as fruits, vegetables, low fat milk, low fat cheese, yogurt, lean meant, poultry, fish, beans, tofu, etc. For more information go to www.thenutritionsource.org  Drink alcohol in moderation- Limit  alcohol intake to less than two drinks a day. Never drink and drive.  Dentist- Brush and floss twice daily; visit your dentist twice a year.  Depression- Your emotional health is as important as your physical health. If you're feeling down, or losing interest in things you would normally enjoy please talk to your healthcare provider.  Eye exam- Visit your eye doctor every year.  Safe sex- If you may be exposed to a sexually transmitted infection, use a condom.  Seat belts- Seat belts can save your life; always wear one.  Smoke/Carbon Monoxide detectors- These detectors need to be installed on the appropriate level of your home.  Replace batteries at least once a year.  Skin cancer- When out in the sun, cover up and use sunscreen 15 SPF or higher.  Violence- If anyone is threatening you, please tell your healthcare provider.  Living Will/ Health care power of attorney- Speak with your healthcare provider and family.    I personally performed the services described in this documentation, which was scribed in my presence. The recorded information has been reviewed and considered, and addended by me as needed.

## 2013-12-27 NOTE — Patient Instructions (Addendum)
No changes in meds this point.  You should receive a call or letter about your lab results within the next week to 10 days.   Keeping you healthy  Get these tests  Blood pressure- Have your blood pressure checked once a year by your healthcare provider.  Normal blood pressure is 120/80  Weight- Have your body mass index (BMI) calculated to screen for obesity.  BMI is a measure of body fat based on height and weight. You can also calculate your own BMI at [email or web address].  Cholesterol- Have your cholesterol checked every year.  Diabetes- Have your blood sugar checked regularly if you have high blood pressure, high cholesterol, have a family history of diabetes or if you are overweight.  Screening for Colon Cancer- Colonoscopy starting at age 46.  Screening may begin sooner depending on your family history and other health conditions. Follow up colonoscopy as directed by your Gastroenterologist.  Screening for Prostate Cancer- Both blood work (PSA) and a rectal exam help screen for Prostate Cancer.  Screening begins at age 18 with African-American men and at age 13 with Caucasian men.  Screening may begin sooner depending on your family history.  Take these medicines  Aspirin- One aspirin daily can help prevent Heart disease and Stroke.  Flu shot- Every fall.  Tetanus- Every 10 years.  Zostavax- Once after the age of 55 to prevent Shingles.  Pneumonia shot- Once after the age of 59; if you are younger than 16, ask your healthcare provider if you need a Pneumonia shot.  Take these steps  Don't smoke- If you do smoke, talk to your doctor about quitting.  For tips on how to quit, go to www.smokefree.gov or call 1-800-QUIT-NOW.  Be physically active- Exercise 5 days a week for at least 30 minutes.  If you are not already physically active start slow and gradually work up to 30 minutes of moderate physical activity.  Examples of moderate activity include walking briskly, mowing  the yard, dancing, swimming, bicycling, etc.  Eat a healthy diet- Eat a variety of healthy food such as fruits, vegetables, low fat milk, low fat cheese, yogurt, lean meant, poultry, fish, beans, tofu, etc. For more information go to www.thenutritionsource.org  Drink alcohol in moderation- Limit alcohol intake to less than two drinks a day. Never drink and drive.  Dentist- Brush and floss twice daily; visit your dentist twice a year.  Depression- Your emotional health is as important as your physical health. If you're feeling down, or losing interest in things you would normally enjoy please talk to your healthcare provider.  Eye exam- Visit your eye doctor every year.  Safe sex- If you may be exposed to a sexually transmitted infection, use a condom.  Seat belts- Seat belts can save your life; always wear one.  Smoke/Carbon Monoxide detectors- These detectors need to be installed on the appropriate level of your home.  Replace batteries at least once a year.  Skin cancer- When out in the sun, cover up and use sunscreen 15 SPF or higher.  Violence- If anyone is threatening you, please tell your healthcare provider.  Living Will/ Health care power of attorney- Speak with your healthcare provider and family.

## 2013-12-28 LAB — PSA: PSA: 2.49 ng/mL (ref <=4.00)

## 2014-01-20 ENCOUNTER — Encounter: Payer: Self-pay | Admitting: Family Medicine

## 2014-12-19 ENCOUNTER — Telehealth: Payer: Self-pay | Admitting: Family Medicine

## 2014-12-19 NOTE — Telephone Encounter (Signed)
lmom to call and reschedule his appt that he had with Carlota Raspberry on 01-02-15

## 2014-12-27 DIAGNOSIS — H43813 Vitreous degeneration, bilateral: Secondary | ICD-10-CM | POA: Diagnosis not present

## 2014-12-27 DIAGNOSIS — H2512 Age-related nuclear cataract, left eye: Secondary | ICD-10-CM | POA: Diagnosis not present

## 2014-12-27 DIAGNOSIS — H35371 Puckering of macula, right eye: Secondary | ICD-10-CM | POA: Diagnosis not present

## 2014-12-27 DIAGNOSIS — H5202 Hypermetropia, left eye: Secondary | ICD-10-CM | POA: Diagnosis not present

## 2015-01-02 ENCOUNTER — Encounter: Payer: Self-pay | Admitting: Family Medicine

## 2015-01-20 ENCOUNTER — Encounter: Payer: Self-pay | Admitting: Family Medicine

## 2015-03-07 ENCOUNTER — Other Ambulatory Visit: Payer: Self-pay | Admitting: Family Medicine

## 2015-03-13 ENCOUNTER — Ambulatory Visit (INDEPENDENT_AMBULATORY_CARE_PROVIDER_SITE_OTHER): Payer: Medicare Other | Admitting: Family Medicine

## 2015-03-13 ENCOUNTER — Encounter: Payer: Self-pay | Admitting: Family Medicine

## 2015-03-13 VITALS — BP 124/72 | HR 65 | Temp 97.5°F | Resp 16 | Ht 69.5 in | Wt 168.2 lb

## 2015-03-13 DIAGNOSIS — Z125 Encounter for screening for malignant neoplasm of prostate: Secondary | ICD-10-CM

## 2015-03-13 DIAGNOSIS — Z8249 Family history of ischemic heart disease and other diseases of the circulatory system: Secondary | ICD-10-CM | POA: Diagnosis not present

## 2015-03-13 DIAGNOSIS — Z0000 Encounter for general adult medical examination without abnormal findings: Secondary | ICD-10-CM

## 2015-03-13 DIAGNOSIS — R2233 Localized swelling, mass and lump, upper limb, bilateral: Secondary | ICD-10-CM | POA: Diagnosis not present

## 2015-03-13 DIAGNOSIS — E785 Hyperlipidemia, unspecified: Secondary | ICD-10-CM | POA: Diagnosis not present

## 2015-03-13 DIAGNOSIS — Z130 Encounter for screening for diseases of the blood and blood-forming organs and certain disorders involving the immune mechanism: Secondary | ICD-10-CM

## 2015-03-13 LAB — COMPLETE METABOLIC PANEL WITH GFR
ALT: 17 U/L (ref 9–46)
AST: 27 U/L (ref 10–35)
Albumin: 4 g/dL (ref 3.6–5.1)
Alkaline Phosphatase: 59 U/L (ref 40–115)
BUN: 14 mg/dL (ref 7–25)
CALCIUM: 9 mg/dL (ref 8.6–10.3)
CHLORIDE: 106 mmol/L (ref 98–110)
CO2: 25 mmol/L (ref 20–31)
CREATININE: 0.92 mg/dL (ref 0.70–1.18)
GFR, Est Non African American: 84 mL/min (ref >=60)
GLUCOSE: 92 mg/dL (ref 65–99)
POTASSIUM: 4.2 mmol/L (ref 3.5–5.3)
Sodium: 140 mmol/L (ref 135–146)
Total Bilirubin: 0.6 mg/dL (ref 0.2–1.2)
Total Protein: 6.4 g/dL (ref 6.1–8.1)

## 2015-03-13 LAB — CBC
HCT: 49 % (ref 39.0–52.0)
Hemoglobin: 17.1 g/dL — ABNORMAL HIGH (ref 13.0–17.0)
MCH: 32.4 pg (ref 26.0–34.0)
MCHC: 34.9 g/dL (ref 30.0–36.0)
MCV: 93 fL (ref 78.0–100.0)
MPV: 9.2 fL (ref 8.6–12.4)
PLATELETS: 237 10*3/uL (ref 150–400)
RBC: 5.27 MIL/uL (ref 4.22–5.81)
RDW: 13.3 % (ref 11.5–15.5)
WBC: 7.2 10*3/uL (ref 4.0–10.5)

## 2015-03-13 LAB — LIPID PANEL
CHOL/HDL RATIO: 3.3 ratio (ref <=5.0)
Cholesterol: 143 mg/dL (ref 125–200)
HDL: 44 mg/dL (ref >=40)
LDL CALC: 85 mg/dL (ref <130)
Triglycerides: 72 mg/dL (ref <150)
VLDL: 14 mg/dL (ref <30)

## 2015-03-13 MED ORDER — ATORVASTATIN CALCIUM 10 MG PO TABS
10.0000 mg | ORAL_TABLET | Freq: Every day | ORAL | Status: DC
Start: 2015-03-13 — End: 2016-01-30

## 2015-03-13 NOTE — Progress Notes (Signed)
   Subjective:    Patient ID: [name], male    DOB: [birth year], 69 y.o.   MRN: [id number]  HPI    Review of Systems  Constitutional: Negative.   HENT: Negative.   Eyes: Negative.   Respiratory: Negative.   Cardiovascular: Negative.   Gastrointestinal: Negative.   Endocrine: Negative.   Genitourinary: Negative.   Musculoskeletal: Negative.   Skin: Negative.   Allergic/Immunologic: Negative.   Neurological: Negative.   Hematological: Negative.   Psychiatric/Behavioral: Negative.        Objective:   Physical Exam        Assessment & Plan:

## 2015-03-13 NOTE — Patient Instructions (Addendum)
Tylenol or rare advil if needed for your shoulder or chest wall soreness. If this is persistent - return to discuss other options or xrays.  Nodules in hands appear to be due to repetitive use.  If painful or trouble with tightness or decreased range of motion in your fingers- return to discuss further.  You should receive a call, email,  or letter about your lab results within the next week to 10 days.  EKG done today, but let me know if you ever want to meet with a cardiologist to discuss stress testing with your family history of heart disease.   Keeping you healthy  Get these tests  Blood pressure- Have your blood pressure checked once a year by your healthcare provider.  Normal blood pressure is 120/80  Weight- Have your body mass index (BMI) calculated to screen for obesity.  BMI is a measure of body fat based on height and weight. You can also calculate your own BMI at [email or web address].  Cholesterol- Have your cholesterol checked every year.  Diabetes- Have your blood sugar checked regularly if you have high blood pressure, high cholesterol, have a family history of diabetes or if you are overweight.  Screening for Colon Cancer- Colonoscopy starting at age 58.  Screening may begin sooner depending on your family history and other health conditions. Follow up colonoscopy as directed by your Gastroenterologist.  Screening for Prostate Cancer- Both blood work (PSA) and a rectal exam help screen for Prostate Cancer.  Screening begins at age 33 with African-American men and at age 56 with Caucasian men.  Screening may begin sooner depending on your family history.  Take these medicines  Aspirin- One aspirin daily can help prevent Heart disease and Stroke.  Flu shot- Every fall.  Tetanus- Every 10 years.  Zostavax- Once after the age of 65 to prevent Shingles.  Pneumonia shot- Once after the age of 78; if you are younger than 96, ask your healthcare provider if you need a  Pneumonia shot.  Take these steps  Don't smoke- If you do smoke, talk to your doctor about quitting.  For tips on how to quit, go to www.smokefree.gov or call 1-800-QUIT-NOW.  Be physically active- Exercise 5 days a week for at least 30 minutes.  If you are not already physically active start slow and gradually work up to 30 minutes of moderate physical activity.  Examples of moderate activity include walking briskly, mowing the yard, dancing, swimming, bicycling, etc.  Eat a healthy diet- Eat a variety of healthy food such as fruits, vegetables, low fat milk, low fat cheese, yogurt, lean meant, poultry, fish, beans, tofu, etc. For more information go to www.thenutritionsource.org  Drink alcohol in moderation- Limit alcohol intake to less than two drinks a day. Never drink and drive.  Dentist- Brush and floss twice daily; visit your dentist twice a year.  Depression- Your emotional health is as important as your physical health. If you're feeling down, or losing interest in things you would normally enjoy please talk to your healthcare provider.  Eye exam- Visit your eye doctor every year.  Safe sex- If you may be exposed to a sexually transmitted infection, use a condom.  Seat belts- Seat belts can save your life; always wear one.  Smoke/Carbon Monoxide detectors- These detectors need to be installed on the appropriate level of your home.  Replace batteries at least once a year.  Skin cancer- When out in the sun, cover up and use sunscreen 15 SPF or higher.  Violence- If anyone is threatening you, please tell your healthcare provider.  Living Will/ Health care power of attorney- Speak with your healthcare provider and family.

## 2015-03-13 NOTE — Progress Notes (Signed)
Subjective:  By signing my name below, I, Jesse Riddle, attest that this documentation has been prepared under the direction and in the presence of Jesse Ray, MD.  Electronically Signed: [name], ED Scribe. 03/13/2015. 9:30 AM.   Patient ID: Jesse Riddle, male    DOB: [birth year], 69 y.o.   MRN: [id number]  HPI   Chief Complaint  Patient presents with  . Annual Exam   HPI Comments: Jesse Riddle is a 69 y.o. male who presents to the [facility] for a medicare physical.   Cancer screening Colonoscopy- 10/2013. There was 1 sigmoid polyp. He is to repeat colonoscopy in 5 years.  Prostate: pt agrees to repeat PSA and rectal exam today.  Lab Results  Component Value Date   PSA 2.49 12/27/2013   PSA 2.51 12/21/2012   Immunizations Immunization History  Administered Date(s) Administered  . Influenza,inj,Quad PF,36+ Mos 12/27/2013  . Influenza-Unspecified 12/31/2014  . Pneumococcal Conjugate-13 12/27/2013  . Pneumococcal Polysaccharide-23 11/22/2011  . Tdap 11/22/2011  . Zoster 02/27/2013  pt is UTD  Depression Depression screen [facility] 2/9 03/13/2015 12/27/2013 12/21/2012  Decreased Interest 0 0 0  Down, Depressed, Hopeless 0 0 0  PHQ - 2 Score 0 0 0   Fall screening: 0  Functional status survey: no positive responses   Vision  Visual Acuity Screening   Right eye Left eye Both eyes  Without correction: 20/20 20/50 20/20   With correction:      Left eye is slightly worse compared to 11/2013. Pt is followed by Optho. States he was told he had cataract in left eye.   Dentist Pt is seen by a dentist regularly. Exercise he exercises 3-4 days a week walking, biking and playing back   Hyperlipidemia  Lab Results  Component Value Date   CHOL 130 12/27/2013   HDL 47 12/27/2013   LDLCALC 72 12/27/2013   TRIG 54 12/27/2013   CHOLHDL 2.8 12/27/2013   Lab Results  Component Value Date   ALT 19 12/27/2013   AST 29 12/27/2013   ALKPHOS 60  12/27/2013   BILITOT 0.5 12/27/2013   Takes lipitor 10mg  qd. Pt denies adverse effects with medications and is okay with current dosage. Pt ran out of medication refill but called in a got a 30 day supply.   Family hx of CAD in his brother. Pt's younger brother was diagnosed with heart disease in his 11 but was also a smoker at 2 packs a day. Declined EKG and stress testing last year. Last EKG in 2013. Pt agrees to have EKG done today.   Cyst on bilateral hands Pt has noticed cyst to the palmar surface of hands bilaterally. Pt does a lot of work with his hands. He will occasionally massage area.   Right shoulder pain Pt occasionally has minor tightness and pulling sensation in right shoulder with certain movements. Pain does not interfere with daily activity and sleep.      There are no active problems to display for this patient.  History reviewed. No pertinent past medical history. History reviewed. No pertinent past surgical history. Allergies  Allergen Reactions  . Penicillins Other (See Comments)    child   Prior to Admission medications   Medication Sig Start Date End Date Taking? Authorizing Provider  aspirin 81 MG tablet Take 81 mg by mouth daily.   Yes Historical Provider, MD  atorvastatin (LIPITOR) 10 MG tablet Take 1 tablet (10 mg total) by mouth daily. PATIENT NEEDS  OFFICE VISIT/ FASTING LABS FOR ADDITIONAL REFILLS 03/08/15  Yes Wendie Agreste, MD  fish oil-omega-3 fatty acids 1000 MG capsule Take 1 g by mouth daily.   Yes Historical Provider, MD  zoster vaccine live, PF, (ZOSTAVAX) 60454 UNT/0.65ML injection Inject 19,400 Units into the skin once. 12/21/12  Yes Wendie Agreste, MD  OVER THE COUNTER MEDICATION Saw [place] taking daily    Historical Provider, MD   Social History   Social History  . Marital Status: Married    Spouse Name: N/A  . Number of Children: N/A  . Years of Education: N/A   Occupational History  . Retired    Social History Main Topics    . Smoking status: Never Smoker   . Smokeless tobacco: Not on file  . Alcohol Use: 0.0 oz/week    0 Standard drinks or equivalent per week     Comment: 4  . Drug Use: No  . Sexual Activity: Yes   Other Topics Concern  . Not on file   Social History Narrative   Married. Education: [employer]. Exercise: walk/bike 3 times a week for 30 minutes.   Review of Systems 13 point ROS reviewed, no positives noted.   Objective:   Physical Exam  Constitutional: He is oriented to person, place, and time. He appears well-developed and well-nourished. No distress.  HENT:  Head: Normocephalic and atraumatic.  Right Ear: External ear normal.  Left Ear: External ear normal.  Mouth/Throat: Oropharynx is clear and moist.  Eyes: Conjunctivae and EOM are normal. Pupils are equal, round, and reactive to light.  Neck: Normal range of motion. Neck supple. No thyromegaly present.  Cardiovascular: Normal rate, regular rhythm, normal heart sounds and intact distal pulses.   Pulmonary/Chest: Effort normal and breath sounds normal. No respiratory distress. He has no wheezes.  Abdominal: Soft. He exhibits no distension. There is no tenderness. Hernia confirmed negative in the right inguinal area and confirmed negative in the left inguinal area.  Genitourinary: Prostate normal.  Musculoskeletal: Normal range of motion. He exhibits no edema or tenderness.  Prominence over right [place]  Lymphadenopathy:    He has no cervical adenopathy.  Neurological: He is alert and oriented to person, place, and time. He has normal reflexes.  Skin: Skin is warm and dry.  Nodule area proximal to right 4th MCP and left 4th MCP. Non tender. Full ROM of fingers.  Psychiatric: He has a normal mood and affect. His behavior is normal.  Nursing note and vitals reviewed.   Filed Vitals:   03/13/15 0910  BP: 124/72  Pulse: 65  Temp: 97.5 F (36.4 C)  TempSrc: Oral  Resp: 16  Height: 5' 9.5" (1.765 m)  Weight: 168 lb 3.2 oz (76.295  kg)  SpO2: 98%   EKG- sinus rhythm RSR' NV-I nonspecific T in III  No acute findings otherwise.    Assessment & Plan:   [name] is a 69 y.o. male Medicare annual wellness visit, subsequent  --anticipatory guidance as below in AVS, screening labs above. Health maintenance items as above in HPI discussed/recommended as applicable.   Hyperlipidemia - Plan: COMPLETE METABOLIC PANEL WITH GFR, Lipid panel, atorvastatin (LIPITOR) 10 MG tablet  - stable prior. Continue same dose lipitor for now.   Screening for prostate cancer - Plan: PSA, Medicare  -We discussed pros and cons of prostate cancer screening, and after this discussion, he chose to have screening done. PSA obtained, and no concerning findings on DRE.   Nodule of finger  of both hands  -suspected overuse/chronic issue with nodules.  No contracture or change in ROM. Nontender.  No specific treatment now.   Family history of cardiovascular disease - Plan: EKG 12-Lead  -no acute findings on EKG.  asymptomatic with exercise. Discussed stress testing with FH of CAD, but declined at present. rtc precautions.   -rare chest wall soreness. Can try otc tylenol or intermittent advil for short course if needed. Rtc/ER  precautions.   Screening, anemia, deficiency, iron - Plan: CBC    Meds ordered this encounter  Medications  . atorvastatin (LIPITOR) 10 MG tablet    Sig: Take 1 tablet (10 mg total) by mouth daily.    Dispense:  90 tablet    Refill:  3   Patient Instructions  Tylenol or rare advil if needed for your shoulder or chest wall soreness. If this is persistent - return to discuss other options or xrays.  Nodules in hands appear to be due to repetitive use.  If painful or trouble with tightness or decreased range of motion in your fingers- return to discuss further.  You should receive a call, email,  or letter about your lab results within the next week to 10 days.  EKG done today, but let me know if you ever want to meet  with a cardiologist to discuss stress testing with your family history of heart disease.   Keeping you healthy  Get these tests  Blood pressure- Have your blood pressure checked once a year by your healthcare provider.  Normal blood pressure is 120/80  Weight- Have your body mass index (BMI) calculated to screen for obesity.  BMI is a measure of body fat based on height and weight. You can also calculate your own BMI at [email or web address].[email or web address].  Cholesterol- Have your cholesterol checked every year.  Diabetes- Have your blood sugar checked regularly if you have high blood pressure, high cholesterol, have a family history of diabetes or if you are overweight.  Screening for Colon Cancer- Colonoscopy starting at age 73.  Screening may begin sooner depending on your family history and other health conditions. Follow up colonoscopy as directed by your Gastroenterologist.  Screening for Prostate Cancer- Both blood work (PSA) and a rectal exam help screen for Prostate Cancer.  Screening begins at age 67 with African-American men and at age 70 with Caucasian men.  Screening may begin sooner depending on your family history.  Take these medicines  Aspirin- One aspirin daily can help prevent Heart disease and Stroke.  Flu shot- Every fall.  Tetanus- Every 10 years.  Zostavax- Once after the age of 58 to prevent Shingles.  Pneumonia shot- Once after the age of 90; if you are younger than 79, ask your healthcare provider if you need a Pneumonia shot.  Take these steps  Don't smoke- If you do smoke, talk to your doctor about quitting.  For tips on how to quit, go to www.smokefree.gov or call 1-800-QUIT-NOW.  Be physically active- Exercise 5 days a week for at least 30 minutes.  If you are not already physically active start slow and gradually work up to 30 minutes of moderate physical activity.  Examples of moderate activity include walking briskly, mowing the yard, dancing, swimming,  bicycling, etc.  Eat a healthy diet- Eat a variety of healthy food such as fruits, vegetables, low fat milk, low fat cheese, yogurt, lean meant, poultry, fish, beans, tofu, etc. For more information go to www.thenutritionsource.org  Drink alcohol in moderation- Limit alcohol intake  to less than two drinks a day. Never drink and drive.  Dentist- Brush and floss twice daily; visit your dentist twice a year.  Depression- Your emotional health is as important as your physical health. If you're feeling down, or losing interest in things you would normally enjoy please talk to your healthcare provider.  Eye exam- Visit your eye doctor every year.  Safe sex- If you may be exposed to a sexually transmitted infection, use a condom.  Seat belts- Seat belts can save your life; always wear one.  Smoke/Carbon Monoxide detectors- These detectors need to be installed on the appropriate level of your home.  Replace batteries at least once a year.  Skin cancer- When out in the sun, cover up and use sunscreen 15 SPF or higher.  Violence- If anyone is threatening you, please tell your healthcare provider.  Living Will/ Health care power of attorney- Speak with your healthcare provider and family.       I personally performed the services described in this documentation, which was scribed in my presence. The recorded information has been reviewed and considered, and addended by me as needed.

## 2015-03-14 LAB — PSA, MEDICARE: PSA: 3.16 ng/mL (ref <=4.00)

## 2015-04-28 ENCOUNTER — Telehealth: Payer: Self-pay

## 2015-04-28 ENCOUNTER — Ambulatory Visit (INDEPENDENT_AMBULATORY_CARE_PROVIDER_SITE_OTHER): Payer: Medicare Other | Admitting: Physician Assistant

## 2015-04-28 VITALS — BP 140/74 | HR 80 | Temp 98.1°F | Resp 16 | Ht 69.5 in | Wt 170.4 lb

## 2015-04-28 DIAGNOSIS — Z23 Encounter for immunization: Secondary | ICD-10-CM

## 2015-04-28 DIAGNOSIS — S40862A Insect bite (nonvenomous) of left upper arm, initial encounter: Secondary | ICD-10-CM | POA: Diagnosis not present

## 2015-04-28 DIAGNOSIS — W57XXXA Bitten or stung by nonvenomous insect and other nonvenomous arthropods, initial encounter: Secondary | ICD-10-CM | POA: Diagnosis not present

## 2015-04-28 NOTE — Progress Notes (Signed)
   04/28/2015 12:25 PM   DOB: 06-02-1945 / MRN: [id number]  SUBJECTIVE:  Jesse Riddle is a 70 y.o. male presenting for an attached tick on his upper arm on bicepps area that has been present for about three days. Denies rash, fever, chills, HA, and diaphoresis.   He is allergic to penicillins.   He  has no past medical history on file.    He  reports that he has never smoked. He does not have any smokeless tobacco history on file. He reports that he drinks alcohol. He reports that he does not use illicit drugs. He  reports that he currently engages in sexual activity. The patient  has no past surgical history on file.  His family history includes Heart disease in his brother and brother; Mental illness in his father; Stroke in his mother.  ROS  Per HPI   Problem list and medications reviewed and updated by myself where necessary, and exist elsewhere in the encounter.   OBJECTIVE:  BP 140/74 mmHg  Pulse 80  Temp(Src) 98.1 F (36.7 C) (Oral)  Resp 16  Ht 5' 9.5" (1.765 m)  Wt 170 lb 6.4 oz (77.293 kg)  BMI 24.81 kg/m2  SpO2 98%  Physical Exam  Constitutional: He is oriented to person, place, and time. He appears well-developed. He does not appear ill.  Eyes: Conjunctivae and EOM are normal. Pupils are equal, round, and reactive to light.  Cardiovascular: Normal rate.   Pulmonary/Chest: Effort normal.  Abdominal: He exhibits no distension.  Musculoskeletal: Normal range of motion.  Neurological: He is alert and oriented to person, place, and time. No cranial nerve deficit. Coordination normal.  Skin: Skin is warm and dry. He is not diaphoretic.     Psychiatric: He has a normal mood and affect.  Nursing note and vitals reviewed.   No results found for this or any previous visit (from the past 72 hour(s)).  No results found.  ASSESSMENT AND PLAN  [name] was seen today for tick bite on lt arm  x noticed yesterday.  Diagnoses and all orders for this visit:  Need for  immunization against tetanus alone -     Td vaccine greater than or equal to 7yo preservative free IM  Tick bite with subsequent removal of tick: Tic removed without difficulty and found to be intact with microscopic examination.  Advised that he contact the office if he develops rash, fever, myalgia, or HA.  Will treat with Doxy 100 mg po bid for 15 days.     The patient was advised to call or return to clinic if he does not see an improvement in symptoms or to seek the care of the closest emergency department if he worsens with the above plan.   Philis Fendt, MHS, PA-C [facility] 04/28/2015 12:25 PM

## 2015-04-28 NOTE — Telephone Encounter (Signed)
I do not see a lab for Lyme disease.

## 2015-04-28 NOTE — Telephone Encounter (Signed)
Asking if tested for Lyme disease?    Tic removed.    [phone]

## 2015-04-28 NOTE — Addendum Note (Signed)
Addended by: Fara Chute on: 04/28/2015 12:50 PM   Modules accepted: SmartSet

## 2015-05-01 NOTE — Telephone Encounter (Signed)
He was not tested nor was the tic.

## 2015-07-18 DIAGNOSIS — M9905 Segmental and somatic dysfunction of pelvic region: Secondary | ICD-10-CM | POA: Diagnosis not present

## 2015-07-18 DIAGNOSIS — M9903 Segmental and somatic dysfunction of lumbar region: Secondary | ICD-10-CM | POA: Diagnosis not present

## 2015-07-18 DIAGNOSIS — M545 Low back pain: Secondary | ICD-10-CM | POA: Diagnosis not present

## 2015-07-18 DIAGNOSIS — S73199A Other sprain of unspecified hip, initial encounter: Secondary | ICD-10-CM | POA: Diagnosis not present

## 2015-10-26 DIAGNOSIS — L718 Other rosacea: Secondary | ICD-10-CM | POA: Diagnosis not present

## 2015-10-26 DIAGNOSIS — D2372 Other benign neoplasm of skin of left lower limb, including hip: Secondary | ICD-10-CM | POA: Diagnosis not present

## 2015-11-06 DIAGNOSIS — S73199A Other sprain of unspecified hip, initial encounter: Secondary | ICD-10-CM | POA: Diagnosis not present

## 2015-11-06 DIAGNOSIS — M9903 Segmental and somatic dysfunction of lumbar region: Secondary | ICD-10-CM | POA: Diagnosis not present

## 2015-11-06 DIAGNOSIS — M9905 Segmental and somatic dysfunction of pelvic region: Secondary | ICD-10-CM | POA: Diagnosis not present

## 2016-01-30 ENCOUNTER — Other Ambulatory Visit: Payer: Self-pay | Admitting: Family Medicine

## 2016-01-30 DIAGNOSIS — E785 Hyperlipidemia, unspecified: Secondary | ICD-10-CM

## 2016-02-13 DIAGNOSIS — H2512 Age-related nuclear cataract, left eye: Secondary | ICD-10-CM | POA: Diagnosis not present

## 2016-02-13 DIAGNOSIS — H43813 Vitreous degeneration, bilateral: Secondary | ICD-10-CM | POA: Diagnosis not present

## 2016-02-13 DIAGNOSIS — H524 Presbyopia: Secondary | ICD-10-CM | POA: Diagnosis not present

## 2016-02-13 DIAGNOSIS — H35371 Puckering of macula, right eye: Secondary | ICD-10-CM | POA: Diagnosis not present

## 2016-03-14 ENCOUNTER — Encounter: Payer: Self-pay | Admitting: Family Medicine

## 2016-03-14 ENCOUNTER — Ambulatory Visit (INDEPENDENT_AMBULATORY_CARE_PROVIDER_SITE_OTHER): Payer: Medicare Other | Admitting: Family Medicine

## 2016-03-14 VITALS — BP 120/68 | HR 93 | Temp 98.2°F | Resp 16 | Ht 69.25 in | Wt 172.6 lb

## 2016-03-14 DIAGNOSIS — Z0000 Encounter for general adult medical examination without abnormal findings: Secondary | ICD-10-CM

## 2016-03-14 DIAGNOSIS — E785 Hyperlipidemia, unspecified: Secondary | ICD-10-CM

## 2016-03-14 DIAGNOSIS — Z114 Encounter for screening for human immunodeficiency virus [HIV]: Secondary | ICD-10-CM

## 2016-03-14 DIAGNOSIS — Z1159 Encounter for screening for other viral diseases: Secondary | ICD-10-CM | POA: Diagnosis not present

## 2016-03-14 MED ORDER — ATORVASTATIN CALCIUM 10 MG PO TABS: 10.0000 mg | ORAL_TABLET | Freq: Every day | ORAL | 1 refills | Status: DC

## 2016-03-14 NOTE — Patient Instructions (Addendum)
No change in meds for now.   Based on your brother's history, could consider high-sensitivity C-reactive protein and coronary artery calcium scoring to look further into cardiac disease risk. Let me know if you would like for me to either order or refer you for this testing.   Keeping you healthy  Get these tests  Blood pressure- Have your blood pressure checked once a year by your healthcare provider.  Normal blood pressure is 120/80  Weight- Have your body mass index (BMI) calculated to screen for obesity.  BMI is a measure of body fat based on height and weight. You can also calculate your own BMI at [email or web address].  Cholesterol- Have your cholesterol checked every year.  Diabetes- Have your blood sugar checked regularly if you have high blood pressure, high cholesterol, have a family history of diabetes or if you are overweight.  Screening for Colon Cancer- Colonoscopy starting at age 77.  Screening may begin sooner depending on your family history and other health conditions. Follow up colonoscopy as directed by your Gastroenterologist.  Screening for Prostate Cancer- Both blood work (PSA) and a rectal exam help screen for Prostate Cancer.  Screening begins at age 90 with African-American men and at age 27 with Caucasian men.  Screening may begin sooner depending on your family history.  Take these medicines  Aspirin- One aspirin daily can help prevent Heart disease and Stroke.  Flu shot- Every fall.  Tetanus- Every 10 years.  Zostavax- Once after the age of 48 to prevent Shingles.  Pneumonia shot- Once after the age of 34; if you are younger than 78, ask your healthcare provider if you need a Pneumonia shot.  Take these steps  Don't smoke- If you do smoke, talk to your doctor about quitting.  For tips on how to quit, go to www.smokefree.gov or call 1-800-QUIT-NOW.  Be physically active- Exercise 5 days a week for at least 30 minutes.  If you are not already  physically active start slow and gradually work up to 30 minutes of moderate physical activity.  Examples of moderate activity include walking briskly, mowing the yard, dancing, swimming, bicycling, etc.  Eat a healthy diet- Eat a variety of healthy food such as fruits, vegetables, low fat milk, low fat cheese, yogurt, lean meant, poultry, fish, beans, tofu, etc. For more information go to www.thenutritionsource.org  Drink alcohol in moderation- Limit alcohol intake to less than two drinks a day. Never drink and drive.  Dentist- Brush and floss twice daily; visit your dentist twice a year.  Depression- Your emotional health is as important as your physical health. If you're feeling down, or losing interest in things you would normally enjoy please talk to your healthcare provider.  Eye exam- Visit your eye doctor every year.  Safe sex- If you may be exposed to a sexually transmitted infection, use a condom.  Seat belts- Seat belts can save your life; always wear one.  Smoke/Carbon Monoxide detectors- These detectors need to be installed on the appropriate level of your home.  Replace batteries at least once a year.  Skin cancer- When out in the sun, cover up and use sunscreen 15 SPF or higher.  Violence- If anyone is threatening you, please tell your healthcare provider.  Living Will/ Health care power of attorney- Speak with your healthcare provider and family.  IF you received an x-ray today, you will receive an invoice from [facility]. Please contact [facility] at [phone] with questions or concerns regarding your invoice.  IF you received labwork today, you will receive an invoice from [facility]. Please contact [facility] at [phone] with questions or concerns regarding your invoice.   Our billing staff will not be able to assist you with questions regarding bills from these companies.  You will be contacted with the lab results as soon as they are  available. The fastest way to get your results is to activate your My Chart account. Instructions are located on the last page of this paperwork. If you have not heard from us regarding the results in 2 weeks, please contact this office.     

## 2016-03-14 NOTE — Progress Notes (Signed)
By signing my name below I, Tereasa Coop, attest that this documentation has been prepared under the direction and in the presence of Wendie Agreste, MD. Electonically Signed. Tereasa Coop, Scribe 03/14/2016 at 3:43 PM  Subjective:    Patient ID: Jesse Riddle, male    DOB: May 22, 1945, 70 y.o.   MRN: [id number]  Chief Complaint  Patient presents with  . Annual Exam    HPI Jesse Riddle is a 70 y.o. male who presents to the [facility] for his annual physical. Pt last seen by Dr Carlota Raspberry for physical in Dec 2016.   Colon Cancer screening Pt's last colonoscopy was in 2015, there was 1 sigmoid polyp found at that time, planned for repeat colonoscopy in 5 years.   Prostate cancer screening Lab Results  Component Value Date   PSA 3.16 03/13/2015   PSA 2.49 12/27/2013   PSA 2.51 12/21/2012   HIV and Hep C screening Pt agrees to testing for HIV and Hep C today.  Immunizations Immunization History  Administered Date(s) Administered  . Influenza,inj,Quad PF,36+ Mos 12/27/2013  . Influenza-Unspecified 12/31/2014, 01/08/2016  . Pneumococcal Conjugate-13 12/27/2013  . Pneumococcal Polysaccharide-23 11/22/2011  . Tdap 11/22/2011  . Zoster 02/27/2013   Depression screening Depression screen [facility] 2/9 03/14/2016 04/28/2015 03/13/2015 12/27/2013 12/21/2012  Decreased Interest 0 0 0 0 0  Down, Depressed, Hopeless 0 0 0 0 0  PHQ - 2 Score 0 0 0 0 0   Vision screening  Visual Acuity Screening   Right eye Left eye Both eyes  Without correction:  20/50 20/20  With correction: 20/25    Pt is followed by opthalmology and was last seen 2 weeks ago.   Fall screening No falls in the past year.  Functional Status Survey: Is the patient deaf or have difficulty hearing?: No Does the patient have difficulty seeing, even when wearing glasses/contacts?: No Does the patient have difficulty concentrating, remembering, or making decisions?: No Does the patient have  difficulty walking or climbing stairs?: No Does the patient have difficulty dressing or bathing?: No Does the patient have difficulty doing errands alone such as visiting a doctor's office or shopping?: No  Advanced directives Pt has an advanced directive with POA/living will. Copy requested.  Dentist Pt is seen by dentist every 6 months.   Exercise Pt walks 2 miles 3 times a week. Pt also bikes. Denies any CP or SOB with biking.   HLD Pt is on fish oildand 10mg  lipitor QD and aspirin 81mg  QD. Pt also on coQ-10 Lab Results  Component Value Date   CHOL 143 03/13/2015   HDL 44 03/13/2015   LDLCALC 85 03/13/2015   TRIG 72 03/13/2015   CHOLHDL 3.3 03/13/2015   Family history of CAD Pt's brother had Heart disease in his 57s and had a CABG. Pt's brother was a heavy smoker, drank alcohol regularly, and did not exercise.   Note pt denies history of smoking. Pt also had an EKG last year in Dec 2016 that showed no acute findings.    There are no active problems to display for this patient.  No past medical history on file. No past surgical history on file. Allergies  Allergen Reactions  . Penicillins Other (See Comments)    child   Prior to Admission medications   Medication Sig Start Date End Date Taking? Authorizing Provider  aspirin 81 MG tablet Take 81 mg by mouth daily.   Yes Historical Provider, MD  atorvastatin (  LIPITOR) 10 MG tablet TAKE 1 TABLET BY MOUTH  DAILY 02/01/16  Yes Wendie Agreste, MD  fish oil-omega-3 fatty acids 1000 MG capsule Take 1 g by mouth daily.   Yes Historical Provider, MD  [place] taking daily    Historical Provider, MD   Social History   Social History  . Marital status: Married    Spouse name: N/A  . Number of children: N/A  . Years of education: N/A   Occupational History  . Retired [employer]   Social History Main Topics  . Smoking status: Never Smoker  . Smokeless tobacco: Not on file  .  Alcohol use 0.0 oz/week     Comment: 4  . Drug use: No  . Sexual activity: Yes   Other Topics Concern  . Not on file   Social History Narrative   Married. Education: [employer]. Exercise: walk/bike 3 times a week for 30 minutes.      Review of Systems 13 systems reviewed and negative    Objective:   Physical Exam  Constitutional: He is oriented to person, place, and time. He appears well-developed and well-nourished.  HENT:  Head: Normocephalic and atraumatic.  Right Ear: External ear normal.  Left Ear: External ear normal.  Mouth/Throat: Oropharynx is clear and moist.  Eyes: Conjunctivae and EOM are normal. Pupils are equal, round, and reactive to light.  Neck: Normal range of motion. Neck supple. No thyromegaly present.  Cardiovascular: Normal rate, regular rhythm, normal heart sounds and intact distal pulses.   Pulmonary/Chest: Effort normal and breath sounds normal. No respiratory distress. He has no wheezes.  Abdominal: Soft. He exhibits no distension. There is no tenderness. Hernia confirmed negative in the right inguinal area and confirmed negative in the left inguinal area.  Genitourinary: Prostate normal.  Musculoskeletal: Normal range of motion. He exhibits no edema or tenderness.  Lymphadenopathy:    He has no cervical adenopathy.  Neurological: He is alert and oriented to person, place, and time. He has normal reflexes.  Skin: Skin is warm and dry.  Psychiatric: He has a normal mood and affect. His behavior is normal.  Vitals reviewed.    Vitals:   03/14/16 1459  BP: 120/68  Pulse: 93  Resp: 16  Temp: 98.2 F (36.8 C)  TempSrc: Oral  SpO2: 96%  Weight: 172 lb 9.6 oz (78.3 kg)  Height: 5' 9.25" (1.759 m)         Assessment & Plan:   Jesse Riddle is a 70 y.o. male Annual physical exam  -  - anticipatory guidance as below in AVS, screening labs if needed. Health maintenance items as above in HPI discussed/recommended as applicable.   - no  concerning responses on depression, fall, or functional status screening. Any positive responses noted as above. Advanced directives discussed as in CHL.   Hyperlipidemia, unspecified hyperlipidemia type - Plan: atorvastatin (LIPITOR) 10 MG tablet, Lipid panel, Comprehensive metabolic panel, CANCELED: Comprehensive metabolic panel, CANCELED: Lipid panel  - Tolerating Lipitor at current dose. Labs pending, meds refilled.  -Family history of coronary disease, but was in his brother who was a smoker.  Asymptomatic with exercise. Consider coronary calcium score or high-sensitivity CRP.  Need for hepatitis C screening test - Plan: Hepatitis C antibody, CANCELED: Hepatitis C antibody  Encounter for screening for HIV - Plan: HIV antibody, CANCELED: HIV antibody   Meds ordered this encounter  Medications  . atorvastatin (LIPITOR) 10 MG tablet    Sig:  Take 1 tablet (10 mg total) by mouth daily.    Dispense:  90 tablet    Refill:  1   Patient Instructions   No change in meds for now.   Based on your brother's history, could consider high-sensitivity C-reactive protein and coronary artery calcium scoring to look further into cardiac disease risk. Let me know if you would like for me to either order or refer you for this testing.   Keeping you healthy  Get these tests  Blood pressure- Have your blood pressure checked once a year by your healthcare provider.  Normal blood pressure is 120/80  Weight- Have your body mass index (BMI) calculated to screen for obesity.  BMI is a measure of body fat based on height and weight. You can also calculate your own BMI at ViewBanking.si.  Cholesterol- Have your cholesterol checked every year.  Diabetes- Have your blood sugar checked regularly if you have high blood pressure, high cholesterol, have a family history of diabetes or if you are overweight.  Screening for Colon Cancer- Colonoscopy starting at age 71.  Screening may begin sooner  depending on your family history and other health conditions. Follow up colonoscopy as directed by your Gastroenterologist.  Screening for Prostate Cancer- Both blood work (PSA) and a rectal exam help screen for Prostate Cancer.  Screening begins at age 54 with African-American men and at age 31 with Caucasian men.  Screening may begin sooner depending on your family history.  Take these medicines  Aspirin- One aspirin daily can help prevent Heart disease and Stroke.  Flu shot- Every fall.  Tetanus- Every 10 years.  Zostavax- Once after the age of 66 to prevent Shingles.  Pneumonia shot- Once after the age of 52; if you are younger than 34, ask your healthcare provider if you need a Pneumonia shot.  Take these steps  Don't smoke- If you do smoke, talk to your doctor about quitting.  For tips on how to quit, go to www.smokefree.gov or call 1-800-QUIT-NOW.  Be physically active- Exercise 5 days a week for at least 30 minutes.  If you are not already physically active start slow and gradually work up to 30 minutes of moderate physical activity.  Examples of moderate activity include walking briskly, mowing the yard, dancing, swimming, bicycling, etc.  Eat a healthy diet- Eat a variety of healthy food such as fruits, vegetables, low fat milk, low fat cheese, yogurt, lean meant, poultry, fish, beans, tofu, etc. For more information go to www.thenutritionsource.org  Drink alcohol in moderation- Limit alcohol intake to less than two drinks a day. Never drink and drive.  Dentist- Brush and floss twice daily; visit your dentist twice a year.  Depression- Your emotional health is as important as your physical health. If you're feeling down, or losing interest in things you would normally enjoy please talk to your healthcare provider.  Eye exam- Visit your eye doctor every year.  Safe sex- If you may be exposed to a sexually transmitted infection, use a condom.  Seat belts- Seat belts can save  your life; always wear one.  Smoke/Carbon Monoxide detectors- These detectors need to be installed on the appropriate level of your home.  Replace batteries at least once a year.  Skin cancer- When out in the sun, cover up and use sunscreen 15 SPF or higher.  Violence- If anyone is threatening you, please tell your healthcare provider.  Living Will/ Health care power of attorney- Speak with your healthcare provider and family.  IF you received an x-ray today, you will receive an invoice from [facility]. Please contact [facility] at [phone] with questions or concerns regarding your invoice.   IF you received labwork today, you will receive an invoice from [place] [facility]. Please contact [facility] at [phone] with questions or concerns regarding your invoice.   Our billing staff will not be able to assist you with questions regarding bills from these companies.  You will be contacted with the lab results as soon as they are available. The fastest way to get your results is to activate your My Chart account. Instructions are located on the last page of this paperwork. If you have not heard from [place] regarding the results in 2 weeks, please contact this office.       I personally performed the services described in this documentation, which was scribed in my presence. The recorded information has been reviewed and considered, and addended by me as needed.   Signed,   [name], MD [facility].  03/16/16 7:53 PM

## 2016-03-15 LAB — COMPREHENSIVE METABOLIC PANEL
ALT: 20 IU/L (ref 0–44)
AST: 32 IU/L (ref 0–40)
Albumin/Globulin Ratio: 2.1 (ref 1.2–2.2)
Albumin: 4.5 g/dL (ref 3.5–4.8)
Alkaline Phosphatase: 73 IU/L (ref 39–117)
BILIRUBIN TOTAL: 0.4 mg/dL (ref 0.0–1.2)
BUN/Creatinine Ratio: 15 (ref 10–24)
BUN: 15 mg/dL (ref 8–27)
CHLORIDE: 103 mmol/L (ref 96–106)
CO2: 25 mmol/L (ref 18–29)
Calcium: 9.3 mg/dL (ref 8.6–10.2)
Creatinine, Ser: 0.97 mg/dL (ref 0.76–1.27)
GFR calc Af Amer: 90 mL/min/{1.73_m2} (ref >59)
GFR calc non Af Amer: 78 mL/min/{1.73_m2} (ref >59)
GLUCOSE: 84 mg/dL (ref 65–99)
Globulin, Total: 2.1 g/dL (ref 1.5–4.5)
Potassium: 5.2 mmol/L (ref 3.5–5.2)
Sodium: 142 mmol/L (ref 134–144)
Total Protein: 6.6 g/dL (ref 6.0–8.5)

## 2016-03-15 LAB — LIPID PANEL
CHOL/HDL RATIO: 2.9 ratio (ref 0.0–5.0)
Cholesterol, Total: 159 mg/dL (ref 100–199)
HDL: 55 mg/dL (ref >39)
LDL Calculated: 90 mg/dL (ref 0–99)
Triglycerides: 69 mg/dL (ref 0–149)
VLDL CHOLESTEROL CAL: 14 mg/dL (ref 5–40)

## 2016-03-15 LAB — HIV ANTIBODY (ROUTINE TESTING W REFLEX): HIV Screen 4th Generation wRfx: NONREACTIVE

## 2016-03-15 LAB — HEPATITIS C ANTIBODY

## 2016-04-03 ENCOUNTER — Telehealth: Payer: Self-pay

## 2016-04-24 ENCOUNTER — Telehealth: Payer: Self-pay | Admitting: Family Medicine

## 2016-04-24 DIAGNOSIS — E785 Hyperlipidemia, unspecified: Secondary | ICD-10-CM

## 2016-04-24 NOTE — Telephone Encounter (Signed)
Pt came Into our office wanting a refill on his Atorvastation he was here for a CPE 03-14-16 and has new insurance now it [place]  Please fax RX to [phone] call pt when its been done

## 2016-04-25 ENCOUNTER — Telehealth: Payer: Self-pay

## 2016-04-25 ENCOUNTER — Telehealth: Payer: Self-pay | Admitting: Family Medicine

## 2016-04-25 DIAGNOSIS — E785 Hyperlipidemia, unspecified: Secondary | ICD-10-CM

## 2016-04-25 DIAGNOSIS — Z125 Encounter for screening for malignant neoplasm of prostate: Secondary | ICD-10-CM

## 2016-04-25 NOTE — Telephone Encounter (Signed)
lmom that we put a copy of his last month bill in the mail

## 2016-04-25 NOTE — Telephone Encounter (Signed)
Patient would like to  Know why his PSA levels were not drawn when he had his physical in [place]. States he was asking during the visit if he wanted it checked and he stated he did but it is not on his lab report that he got from [place].  He would like someone to call him back concerning this his number is [phone]

## 2016-04-26 MED ORDER — ATORVASTATIN CALCIUM 10 MG PO TABS: 10.0000 mg | ORAL_TABLET | Freq: Every day | ORAL | 1 refills | Status: DC

## 2016-04-26 NOTE — Telephone Encounter (Signed)
I called patient.  In review of last visit it does not appear that I ordered a PSA. I apologized for that not being ordered, and I placed a PSA future order in for him to return at his convenience for blood draw only. No other questions.

## 2016-05-03 ENCOUNTER — Other Ambulatory Visit: Payer: Medicare HMO

## 2016-05-03 DIAGNOSIS — Z125 Encounter for screening for malignant neoplasm of prostate: Secondary | ICD-10-CM | POA: Diagnosis not present

## 2016-05-04 LAB — PSA: Prostate Specific Ag, Serum: 2.9 ng/mL (ref 0.0–4.0)

## 2016-06-12 DIAGNOSIS — R69 Illness, unspecified: Secondary | ICD-10-CM | POA: Diagnosis not present

## 2016-08-08 DIAGNOSIS — Z6823 Body mass index (BMI) 23.0-23.9, adult: Secondary | ICD-10-CM | POA: Diagnosis not present

## 2016-08-08 DIAGNOSIS — E7800 Pure hypercholesterolemia, unspecified: Secondary | ICD-10-CM | POA: Diagnosis not present

## 2016-08-08 DIAGNOSIS — Z79899 Other long term (current) drug therapy: Secondary | ICD-10-CM | POA: Diagnosis not present

## 2016-08-08 DIAGNOSIS — Z0000 Encounter for general adult medical examination without abnormal findings: Secondary | ICD-10-CM | POA: Diagnosis not present

## 2016-11-25 ENCOUNTER — Encounter: Payer: Self-pay | Admitting: Family Medicine

## 2016-11-25 ENCOUNTER — Ambulatory Visit (INDEPENDENT_AMBULATORY_CARE_PROVIDER_SITE_OTHER): Payer: Medicare HMO | Admitting: Family Medicine

## 2016-11-25 VITALS — BP 140/82 | HR 66 | Temp 97.4°F | Resp 16 | Ht 70.0 in | Wt 168.0 lb

## 2016-11-25 DIAGNOSIS — R51 Headache: Secondary | ICD-10-CM | POA: Diagnosis not present

## 2016-11-25 DIAGNOSIS — R0981 Nasal congestion: Secondary | ICD-10-CM | POA: Diagnosis not present

## 2016-11-25 DIAGNOSIS — Z23 Encounter for immunization: Secondary | ICD-10-CM

## 2016-11-25 DIAGNOSIS — R519 Headache, unspecified: Secondary | ICD-10-CM

## 2016-11-25 LAB — POCT SEDIMENTATION RATE: POCT SED RATE: 2 mm/hr (ref 0–22)

## 2016-11-25 NOTE — Progress Notes (Addendum)
Subjective:  By signing my name below, I, Moises Blood, attest that this documentation has been prepared under the direction and in the presence of [name], MD. Electronically Signed: Moises Blood, [place]. 11/25/2016 , 9:52 AM .  Patient was seen in Room 1 .   Patient ID: Jesse Riddle, male    DOB: 22-Apr-1945, 71 y.o.   MRN: [id number] Chief Complaint  Patient presents with  . Headache    x3 days relief with ibuprofen   HPI Jesse Riddle is a 71 y.o. male  Patient states he had headache start 5 days ago, and occurred subsequent day and day after. He informs these headaches lasts about 2-3 hours, but is able to have relief after taking 3 tablets of ibuprofen. He denies having headaches yesterday or the day before. However, late last night/early this morning at 1:00AM, he had left sided headache, described worst headache. He wasn't able to breathe out of his left nostril with greenish-gray nasal discharge. He was had to take 4 tablets of ibuprofen.   He mentions having more-than-usual computer screen time last week, up to 10 hours. He also informs going swimming and got water into his nose. He notes rarely having headaches throughout his life. He denies nausea, vomiting, fever, chills, vision changes, neck pain, weakness or slurred speech. He denies headache today. He denies fighting off illness or cold prior to headaches starting.   He agrees to flu shot today.   There are no active problems to display for this patient.  History reviewed. No pertinent past medical history. History reviewed. No pertinent surgical history. Allergies  Allergen Reactions  . Penicillins Other (See Comments)    child   Prior to Admission medications   Medication Sig Start Date End Date Taking? Authorizing Provider  aspirin 81 MG tablet Take 81 mg by mouth daily.    [provider]  atorvastatin (LIPITOR) 10 MG tablet Take 1 tablet (10 mg total) by mouth daily. 04/26/16   Wendie Agreste, MD  fish oil-omega-3 fatty acids 1000 MG capsule Take 1 g by mouth daily.    [provider]  OVER THE COUNTER MEDICATION [place] taking daily    [provider]   Social History   Social History  . Marital status: Married    Spouse name: N/A  . Number of children: N/A  . Years of education: N/A   Occupational History  . Retired [employer]   Social History Main Topics  . Smoking status: Never Smoker  . Smokeless tobacco: Never Used  . Alcohol use 0.0 oz/week     Comment: 4  . Drug use: No  . Sexual activity: Yes   Other Topics Concern  . Not on file   Social History Narrative   Married. Education: [employer]. Exercise: walk/bike 3 times a week for 30 minutes.   Review of Systems  Constitutional: Negative for fatigue, fever and unexpected weight change.  Eyes: Negative for visual disturbance.  Respiratory: Negative for cough, chest tightness and shortness of breath.   Cardiovascular: Negative for chest pain, palpitations and leg swelling.  Gastrointestinal: Negative for abdominal pain, blood in stool, diarrhea, nausea and vomiting.  Neurological: Positive for headaches. Negative for dizziness, facial asymmetry, speech difficulty and light-headedness.       Objective:   Physical Exam  Constitutional: He is oriented to person, place, and time. He appears well-developed and well-nourished. No distress.  HENT:  Head: Normocephalic and atraumatic.  Nose: Right sinus  exhibits no frontal sinus tenderness. Left sinus exhibits no maxillary sinus tenderness and no frontal sinus tenderness.  There is some irritation of his nasal mucosa, slight boggy left turbinate  Eyes: Pupils are equal, round, and reactive to light. EOM are normal.  Neck: Neck supple.  C-spine: pain free ROM  Cardiovascular: Normal rate.   Pulmonary/Chest: Effort normal. No respiratory distress.  Musculoskeletal: Normal range of motion.  Lymphadenopathy:    He has no  cervical adenopathy.  Neurological: He is alert and oriented to person, place, and time. He displays a negative Romberg sign. Coordination and gait normal.  No pronator drift, normal finger-to-nose testing, normal heel-to-toe gait, no focal weakness, no facial droop  Skin: Skin is warm and dry.  Psychiatric: He has a normal mood and affect. His behavior is normal.  Nursing note and vitals reviewed.   Vitals:   11/25/16 0932  BP: (!) 149/80  Pulse: 66  Resp: 16  Temp: (!) 97.4 F (36.3 C)  TempSrc: Oral  SpO2: 98%  Weight: 168 lb (76.2 kg)  Height: 5\' 10"  (1.778 m)   Results for orders placed or performed in visit on 11/25/16  POCT SEDIMENTATION RATE  Result Value Ref Range   POCT SED RATE 2 0 - 22 mm/hr       Assessment & Plan:   Jesse Riddle is a 71 y.o. male Need for prophylactic vaccination and inoculation against influenza - Plan: Flu Vaccine QUAD 6+ mos PF IM (Fluarix Quad PF)  Nonintractable episodic headache, unspecified headache type  Temporal headache - Plan: POCT SEDIMENTATION RATE  Nasal congestion  Headache may be multifactorial. Could be combination of increased screen time, nasal congestion with recent water activities.  Nonfocal neuro exam, and symptom-free this morning.  -Sedimentation rate obtained which was normal, unlikely temporal arteritis.  -  Decrease screen time as possible, start saline nasal spray for nasal congestion  - If severe headache returns, or other worsening symptoms, consider neuroimaging. RTC/ER precautions given   Flu shot given.      No orders of the defined types were placed in this encounter.  Patient Instructions    Your exam is reassuring today, as well as not having a headache this morning. I will check a sedimentation rate to look for temporal arteritis, but suspect that will be normal. Try over-the-counter saline nasal spray for nasal congestion, Tylenol or ibuprofen if needed for mild headache, but if severe  headache returns, would want to check other imaging. Return if the symptoms occur, or emergency room if needed.  Return to the clinic or go to the nearest emergency room if any of your symptoms worsen or new symptoms occur.   General Headache Without Cause A headache is pain or discomfort felt around the head or neck area. The specific cause of a headache may not be found. There are many causes and types of headaches. A few common ones are:  Tension headaches.  Migraine headaches.  Cluster headaches.  Chronic daily headaches.  Follow these instructions at home: Watch your condition for any changes. Take these steps to help with your condition: Managing pain  Take over-the-counter and prescription medicines only as told by your health care provider.  Lie down in a dark, quiet room when you have a headache.  If directed, apply ice to the head and neck area: ? Put ice in a plastic bag. ? Place a towel between your skin and the bag. ? Leave the ice on for 20 minutes, 2-3  times per day.  Use a heating pad or hot shower to apply heat to the head and neck area as told by your health care provider.  Keep lights dim if bright lights bother you or make your headaches worse. Eating and drinking  Eat meals on a regular schedule.  Limit alcohol use.  Decrease the amount of caffeine you drink, or stop drinking caffeine. General instructions  Keep all follow-up visits as told by your health care provider. This is important.  Keep a headache journal to help find out what may trigger your headaches. For example, write down: ? What you eat and drink. ? How much sleep you get. ? Any change to your diet or medicines.  Try massage or other relaxation techniques.  Limit stress.  Sit up straight, and do not tense your muscles.  Do not use tobacco products, including cigarettes, chewing tobacco, or e-cigarettes. If you need help quitting, ask your health care provider.  Exercise  regularly as told by your health care provider.  Sleep on a regular schedule. Get 7-9 hours of sleep, or the amount recommended by your health care provider. Contact a health care provider if:  Your symptoms are not helped by medicine.  You have a headache that is different from the usual headache.  You have nausea or you vomit.  You have a fever. Get help right away if:  Your headache becomes severe.  You have repeated vomiting.  You have a stiff neck.  You have a loss of vision.  You have problems with speech.  You have pain in the eye or ear.  You have muscular weakness or loss of muscle control.  You lose your balance or have trouble walking.  You feel faint or pass out.  You have confusion. This information is not intended to replace advice given to you by your health care provider. Make sure you discuss any questions you have with your health care provider. Document Released: 03/18/2005 Document Revised: 08/24/2015 Document Reviewed: 07/11/2014 Elsevier Interactive Patient Education  2017 [employer].    IF you received an x-ray today, you will receive an invoice from [facility]. Please contact [facility] Radiology at [phone] with questions or concerns regarding your invoice.   IF you received labwork today, you will receive an invoice from [place]. Please contact [facility] at [phone] with questions or concerns regarding your invoice.   Our billing staff will not be able to assist you with questions regarding bills from these companies.  You will be contacted with the lab results as soon as they are available. The fastest way to get your results is to activate your My Chart account. Instructions are located on the last page of this paperwork. If you have not heard from [place] regarding the results in 2 weeks, please contact this office.       I personally performed the services described in this documentation, which was scribed in my  presence. The recorded information has been reviewed and considered for accuracy and completeness, addended by me as needed, and agree with information above.  Signed,   [name], MD [facility].  11/25/16 9:53 AM

## 2016-11-25 NOTE — Patient Instructions (Addendum)
Your exam is reassuring today, as well as not having a headache this morning. I will check a sedimentation rate to look for temporal arteritis, but suspect that will be normal. Try over-the-counter saline nasal spray for nasal congestion, Tylenol or ibuprofen if needed for mild headache, but if severe headache returns, would want to check other imaging. Return if the symptoms occur, or emergency room if needed.  Return to the clinic or go to the nearest emergency room if any of your symptoms worsen or new symptoms occur.   General Headache Without Cause A headache is pain or discomfort felt around the head or neck area. The specific cause of a headache may not be found. There are many causes and types of headaches. A few common ones are:  Tension headaches.  Migraine headaches.  Cluster headaches.  Chronic daily headaches.  Follow these instructions at home: Watch your condition for any changes. Take these steps to help with your condition: Managing pain  Take over-the-counter and prescription medicines only as told by your health care provider.  Lie down in a dark, quiet room when you have a headache.  If directed, apply ice to the head and neck area: ? Put ice in a plastic bag. ? Place a towel between your skin and the bag. ? Leave the ice on for 20 minutes, 2-3 times per day.  Use a heating pad or hot shower to apply heat to the head and neck area as told by your health care provider.  Keep lights dim if bright lights bother you or make your headaches worse. Eating and drinking  Eat meals on a regular schedule.  Limit alcohol use.  Decrease the amount of caffeine you drink, or stop drinking caffeine. General instructions  Keep all follow-up visits as told by your health care provider. This is important.  Keep a headache journal to help find out what may trigger your headaches. For example, write down: ? What you eat and drink. ? How much sleep you get. ? Any change  to your diet or medicines.  Try massage or other relaxation techniques.  Limit stress.  Sit up straight, and do not tense your muscles.  Do not use tobacco products, including cigarettes, chewing tobacco, or e-cigarettes. If you need help quitting, ask your health care provider.  Exercise regularly as told by your health care provider.  Sleep on a regular schedule. Get 7-9 hours of sleep, or the amount recommended by your health care provider. Contact a health care provider if:  Your symptoms are not helped by medicine.  You have a headache that is different from the usual headache.  You have nausea or you vomit.  You have a fever. Get help right away if:  Your headache becomes severe.  You have repeated vomiting.  You have a stiff neck.  You have a loss of vision.  You have problems with speech.  You have pain in the eye or ear.  You have muscular weakness or loss of muscle control.  You lose your balance or have trouble walking.  You feel faint or pass out.  You have confusion. This information is not intended to replace advice given to you by your health care provider. Make sure you discuss any questions you have with your health care provider. Document Released: 03/18/2005 Document Revised: 08/24/2015 Document Reviewed: 07/11/2014 Elsevier Interactive Patient Education  2017 [employer].    IF you received an x-ray today, you will receive an invoice from [facility].  Please contact [facility] at [phone] with questions or concerns regarding your invoice.   IF you received labwork today, you will receive an invoice from [place]. Please contact [facility] at [phone] with questions or concerns regarding your invoice.   Our billing staff will not be able to assist you with questions regarding bills from these companies.  You will be contacted with the lab results as soon as they are available. The fastest way to get your results is  to activate your My Chart account. Instructions are located on the last page of this paperwork. If you have not heard from [place] regarding the results in 2 weeks, please contact this office.

## 2016-11-29 ENCOUNTER — Other Ambulatory Visit: Payer: Self-pay | Admitting: Family Medicine

## 2016-11-29 DIAGNOSIS — E785 Hyperlipidemia, unspecified: Secondary | ICD-10-CM

## 2016-12-03 NOTE — Telephone Encounter (Signed)
mychart message sent to pt about making an apt for more refills °

## 2016-12-09 ENCOUNTER — Telehealth: Payer: Self-pay | Admitting: Family Medicine

## 2016-12-09 NOTE — Telephone Encounter (Signed)
PATIENT STATES HE SAW DR. Carlota Raspberry A FEW WEEKS AGO FOR HEADACHES. DR. Carlota Raspberry TOLD HIM TO CALL HIM BACK IF HE DID NOT GET ANY BETTER AND THEY WOULD GO TO THE NEXT STEP. MR. Jesse Riddle SAID THEY WERE BETTER BUT THE LAST DAY OR 2 THEY HAVE GOTTEN WORSE AGAIN. HE ALSO HAS HEAD CONGESTION. DOES DR. Carlota Raspberry WANT HIM TO COME BACK INTO THE OFFICE OR DOES HE THINK HE NEEDS TO SEE A ENT SPECIALIST NOW? BEST PHONE [phone] (CELL) PHARMACY CHOICE IS [facility] ON [address]. [place]

## 2016-12-12 NOTE — Telephone Encounter (Signed)
Please schedule a follow-up with Dr. Carlota Raspberry

## 2016-12-16 DIAGNOSIS — R69 Illness, unspecified: Secondary | ICD-10-CM | POA: Diagnosis not present

## 2016-12-17 DIAGNOSIS — R69 Illness, unspecified: Secondary | ICD-10-CM | POA: Diagnosis not present

## 2017-01-15 ENCOUNTER — Other Ambulatory Visit: Payer: Self-pay | Admitting: Family Medicine

## 2017-01-15 DIAGNOSIS — E785 Hyperlipidemia, unspecified: Secondary | ICD-10-CM

## 2017-01-17 ENCOUNTER — Other Ambulatory Visit: Payer: Self-pay | Admitting: Family Medicine

## 2017-01-17 DIAGNOSIS — E785 Hyperlipidemia, unspecified: Secondary | ICD-10-CM

## 2017-01-17 NOTE — Telephone Encounter (Signed)
Pt is calling to see if he can get a refill of his Lipitor.  Pt uses the [facility] in for this prescription.  Please advise [phone]

## 2017-01-21 MED ORDER — ATORVASTATIN CALCIUM 10 MG PO TABS
10.0000 mg | ORAL_TABLET | Freq: Every day | ORAL | 0 refills | Status: DC
Start: 2017-01-21 — End: 2017-04-10

## 2017-01-21 NOTE — Telephone Encounter (Signed)
PT NEED A REFILL ON ATORVASTATIN HE'S UPSET STATES THAT HE STILL HAVEN'T HEARD ANYTHING FROM [place] ABOUT HIS REFILL THAT HE IS VERY UPSET AT HOW WE RUN THE OFFICE HE STATES THAT HE NEVER GOT A CALL BACK WHEN HE LEFT A MESSAGE ON 12-09-16 AND 01-17-17 THAT IF HE DOESN'T HEAR FROM SOMEONE TODAY THAT WE CAN KISS HIM AND HIS WIFE GOODBYE HE WANTED ME TO DOCUMENT ALL OF THIS PLEASE RESPOND AS SOON AS POSSIBLE PLEASE

## 2017-01-21 NOTE — Telephone Encounter (Signed)
I called patient to review concerns.   Seen for acute illness on 11/25/16. Phone note on 9/10 - appears was advised to follow up for acute symptoms at that time. Headaches did subside.   Regarding recent request for atorvastatin:  It appears we refilled that on 11/29/16 for #30 with [employer], with Mychart message sent 12/03/16 advising need to schedule appointment, but that has not appear to have been read. He had not logged in so was unaware of that message.   Last lipid panel 03/14/16 and normal at that time. Usually monitor lipids and liver tests every 6 months, but his tests have been stable. It appears only 6 months of meds sent last visit with me in 03/2016 instead of usual 1 year (I have been able to provide 1 year of refills in past). I will refill for #90 to last until office visit in January.    Questions answered as above, and no further concerns at this time.

## 2017-04-09 ENCOUNTER — Ambulatory Visit (INDEPENDENT_AMBULATORY_CARE_PROVIDER_SITE_OTHER): Payer: Medicare HMO

## 2017-04-09 VITALS — BP 110/76 | HR 80 | Ht 70.0 in | Wt 169.5 lb

## 2017-04-09 DIAGNOSIS — Z0000 Encounter for general adult medical examination without abnormal findings: Secondary | ICD-10-CM

## 2017-04-09 DIAGNOSIS — E785 Hyperlipidemia, unspecified: Secondary | ICD-10-CM | POA: Diagnosis not present

## 2017-04-09 NOTE — Patient Instructions (Addendum)
Jesse Riddle , Thank you for taking time to come for your Medicare Wellness Visit. I appreciate your ongoing commitment to your health goals. Please review the following plan we discussed and let me know if I can assist you in the future.   Screening recommendations/referrals:  Colonoscopy: up to date, next due 11/17/2023 Recommended yearly ophthalmology/optometry visit for glaucoma screening and checkup Recommended yearly dental visit for hygiene and checkup  Vaccinations: Influenza vaccine: up to date Pneumococcal vaccine: up to date Tdap vaccine: up to date, next due 11/21/2021 Shingles vaccine: Zostavax completed 02/27/2013, Check with pharmacy about receiving the Shingrix vaccine    Advanced directives: Please bring a copy of your POA (Power of [place]) and/or Living Will to your next appointment.   Conditions/risks identified: Try to start exercising more on a daily basis.   Next appointment: 04/10/2017 @ 9 am with Dr. Carlota Raspberry, next Medicare Wellness visit is 04/10/2018 @ 8:20 am with [place] 65 Years and Older, Male Preventive care refers to lifestyle choices and visits with your health care provider that can promote health and wellness. What does preventive care include?  A yearly physical exam. This is also called an annual well check.  Dental exams once or twice a year.  Routine eye exams. Ask your health care provider how often you should have your eyes checked.  Personal lifestyle choices, including:  Daily care of your teeth and gums.  Regular physical activity.  Eating a healthy diet.  Avoiding tobacco and drug use.  Limiting alcohol use.  Practicing safe sex.  Taking low doses of aspirin every day.  Taking vitamin and mineral supplements as recommended by your health care provider. What happens during an annual well check? The services and screenings done by your health care provider during your annual well check will depend on  your age, overall health, lifestyle risk factors, and family history of disease. Counseling  Your health care provider may ask you questions about your:  Alcohol use.  Tobacco use.  Drug use.  Emotional well-being.  Home and relationship well-being.  Sexual activity.  Eating habits.  History of falls.  Memory and ability to understand (cognition).  Work and work Statistician. Screening  You may have the following tests or measurements:  Height, weight, and BMI.  Blood pressure.  Lipid and cholesterol levels. These may be checked every 5 years, or more frequently if you are over 51 years old.  Skin check.  Lung cancer screening. You may have this screening every year starting at age 16 if you have a 30-pack-year history of smoking and currently smoke or have quit within the past 15 years.  Fecal occult blood test (FOBT) of the stool. You may have this test every year starting at age 72.  Flexible sigmoidoscopy or colonoscopy. You may have a sigmoidoscopy every 5 years or a colonoscopy every 10 years starting at age 98.  Prostate cancer screening. Recommendations will vary depending on your family history and other risks.  Hepatitis C blood test.  Hepatitis B blood test.  Sexually transmitted disease (STD) testing.  Diabetes screening. This is done by checking your blood sugar (glucose) after you have not eaten for a while (fasting). You may have this done every 1-3 years.  Abdominal aortic aneurysm (AAA) screening. You may need this if you are a current or former smoker.  Osteoporosis. You may be screened starting at age 74 if you are at high risk. Talk with your health care  provider about your test results, treatment options, and if necessary, the need for more tests. Vaccines  Your health care provider may recommend certain vaccines, such as:  Influenza vaccine. This is recommended every year.  Tetanus, diphtheria, and acellular pertussis (Tdap, Td) vaccine.  You may need a Td booster every 10 years.  Zoster vaccine. You may need this after age 65.  Pneumococcal 13-valent conjugate (PCV13) vaccine. One dose is recommended after age 14.  Pneumococcal polysaccharide (PPSV23) vaccine. One dose is recommended after age 38. Talk to your health care provider about which screenings and vaccines you need and how often you need them. This information is not intended to replace advice given to you by your health care provider. Make sure you discuss any questions you have with your health care provider. Document Released: 04/14/2015 Document Revised: 12/06/2015 Document Reviewed: 01/17/2015 Elsevier Interactive Patient Education  2017 [place] Prevention in the Home Falls can cause injuries. They can happen to people of all ages. There are many things you can do to make your home safe and to help prevent falls. What can I do on the outside of my home?  Regularly fix the edges of walkways and driveways and fix any cracks.  Remove anything that might make you trip as you walk through a door, such as a raised step or threshold.  Trim any bushes or trees on the path to your home.  Use bright outdoor lighting.  Clear any walking paths of anything that might make someone trip, such as rocks or tools.  Regularly check to see if handrails are loose or broken. Make sure that both sides of any steps have handrails.  Any raised decks and porches should have guardrails on the edges.  Have any leaves, snow, or ice cleared regularly.  Use sand or salt on walking paths during winter.  Clean up any spills in your garage right away. This includes oil or grease spills. What can I do in the bathroom?  Use night lights.  Install grab bars by the toilet and in the tub and shower. Do not use towel bars as grab bars.  Use non-skid mats or decals in the tub or shower.  If you need to sit down in the shower, use a plastic, non-slip stool.  Keep the  floor dry. Clean up any water that spills on the floor as soon as it happens.  Remove soap buildup in the tub or shower regularly.  Attach bath mats securely with double-sided non-slip rug tape.  Do not have throw rugs and other things on the floor that can make you trip. What can I do in the bedroom?  Use night lights.  Make sure that you have a light by your bed that is easy to reach.  Do not use any sheets or blankets that are too big for your bed. They should not hang down onto the floor.  Have a firm chair that has side arms. You can use this for support while you get dressed.  Do not have throw rugs and other things on the floor that can make you trip. What can I do in the kitchen?  Clean up any spills right away.  Avoid walking on wet floors.  Keep items that you use a lot in easy-to-reach places.  If you need to reach something above you, use a strong step stool that has a grab bar.  Keep electrical cords out of the way.  Do not use floor polish  or wax that makes floors slippery. If you must use wax, use non-skid floor wax.  Do not have throw rugs and other things on the floor that can make you trip. What can I do with my stairs?  Do not leave any items on the stairs.  Make sure that there are handrails on both sides of the stairs and use them. Fix handrails that are broken or loose. Make sure that handrails are as long as the stairways.  Check any carpeting to make sure that it is firmly attached to the stairs. Fix any carpet that is loose or worn.  Avoid having throw rugs at the top or bottom of the stairs. If you do have throw rugs, attach them to the floor with carpet tape.  Make sure that you have a light switch at the top of the stairs and the bottom of the stairs. If you do not have them, ask someone to add them for you. What else can I do to help prevent falls?  Wear shoes that:  Do not have high heels.  Have rubber bottoms.  Are comfortable and fit  you well.  Are closed at the toe. Do not wear sandals.  If you use a stepladder:  Make sure that it is fully opened. Do not climb a closed stepladder.  Make sure that both sides of the stepladder are locked into place.  Ask someone to hold it for you, if possible.  Clearly mark and make sure that you can see:  Any grab bars or handrails.  First and last steps.  Where the edge of each step is.  Use tools that help you move around (mobility aids) if they are needed. These include:  Canes.  Walkers.  Scooters.  Crutches.  Turn on the lights when you go into a dark area. Replace any light bulbs as soon as they burn out.  Set up your furniture so you have a clear path. Avoid moving your furniture around.  If any of your floors are uneven, fix them.  If there are any pets around you, be aware of where they are.  Review your medicines with your doctor. Some medicines can make you feel dizzy. This can increase your chance of falling. Ask your doctor what other things that you can do to help prevent falls. This information is not intended to replace advice given to you by your health care provider. Make sure you discuss any questions you have with your health care provider. Document Released: 01/12/2009 Document Revised: 08/24/2015 Document Reviewed: 04/22/2014 Elsevier Interactive Patient Education  2017 [employer].

## 2017-04-09 NOTE — Progress Notes (Signed)
Subjective:   Jesse Riddle is a 72 y.o. male who presents for Medicare Annual/Subsequent preventive examination.  Review of Systems:  N/A Cardiac Risk Factors include: advanced age (>48men, >66 women);male gender;dyslipidemia     Objective:    Vitals: BP 110/76   Pulse 80   Ht 5\' 10"  (1.778 m)   Wt 169 lb 8 oz (76.9 kg)   SpO2 97%   BMI 24.32 kg/m   Body mass index is 24.32 kg/m.  Advanced Directives 04/09/2017 11/25/2016 03/14/2016  Does Patient Have a Medical Advance Directive? Yes Yes Yes  Type of Paramedic of [place];Living will [place];Living will [place];Living will;Out of facility DNR (pink MOST or yellow form)  Does patient want to make changes to medical advance directive? - No - Patient declined No - Patient declined  Copy of [place] in Chart? No - copy requested No - copy requested No - copy requested    Tobacco Social History   Tobacco Use  Smoking Status Never Smoker  Smokeless Tobacco Never Used     Counseling given: Not Answered   Clinical Intake:  Pre-visit preparation completed: Yes  Pain : No/denies pain     Nutritional Status: BMI of 19-24  Normal Nutritional Risks: None Diabetes: No  How often do you need to have someone help you when you read instructions, pamphlets, or other written materials from your doctor or pharmacy?: 1 - Never What is the last grade level you completed in school?: Bachelors Degree  Interpreter Needed?: No  Information entered by :: Andrez Grime, LPN  History reviewed. No pertinent past medical history. History reviewed. No pertinent surgical history. Family History  Problem Relation Age of Onset  . Stroke Mother   . Mental illness Father   . Heart disease Brother   . Heart disease Brother    Social History   Socioeconomic History  . Marital status: Married    Spouse name: None  . Number of children: 3  .  Years of education: None  . Highest education level: Bachelor's degree (e.g., BA, AB, BS)  Social Needs  . Financial resource strain: Not hard at all  . Food insecurity - worry: Never true  . Food insecurity - inability: Never true  . Transportation needs - medical: No  . Transportation needs - non-medical: No  Occupational History  . Occupation: Retired    Fish farm manager: Retail banker  Tobacco Use  . Smoking status: Never Smoker  . Smokeless tobacco: Never Used  Substance and Sexual Activity  . Alcohol use: Yes    Alcohol/week: 0.0 oz    Comment: Occasional  . Drug use: No  . Sexual activity: Yes  Other Topics Concern  . None  Social History Narrative   Married. Education: [employer]. Exercise: walk/bike 3 times a week for 30 minutes.    Outpatient Encounter Medications as of 04/09/2017  Medication Sig  . aspirin 81 MG tablet Take 81 mg by mouth daily.  Marland Kitchen atorvastatin (LIPITOR) 10 MG tablet Take 1 tablet (10 mg total) by mouth daily.  . fish oil-omega-3 fatty acids 1000 MG capsule Take 1 g by mouth daily.  Marland Kitchen OVER THE COUNTER MEDICATION [place] taking daily   Facility-Administered Encounter Medications as of 04/09/2017  Medication  . TDaP (BOOSTRIX) injection 0.5 mL    Activities of Daily Living In your present state of health, do you have any difficulty performing the following activities: 04/09/2017  Hearing?  N  Vision? N  Difficulty concentrating or making decisions? N  Walking or climbing stairs? N  Dressing or bathing? N  Doing errands, shopping? N  Preparing Food and eating ? N  Using the Toilet? N  In the past six months, have you accidently leaked urine? N  Do you have problems with loss of bowel control? N  Managing your Medications? N  Managing your Finances? N  Housekeeping or managing your Housekeeping? N  Some recent data might be hidden    Patient Care Team: Wendie Agreste, MD as PCP - General (Family Medicine)   Assessment:   This is a routine  wellness examination for Jesse Riddle.  Exercise Activities and Dietary recommendations Current Exercise Habits: Home exercise routine, Type of exercise: walking, Time (Minutes): 25, Frequency (Times/Week): 3, Weekly Exercise (Minutes/Week): 75, Intensity: Mild, Exercise limited by: None identified  Goals    . Exercise 3x per week (30 min per time)     Patient states that he wants to try to start exercising more on a daily basis.        Fall Risk Fall Risk  04/09/2017 11/25/2016 03/14/2016 03/13/2015 12/27/2013  Falls in the past year? No No No No No  Risk for fall due to : - - - - -  Risk for fall due to: Comment - - - - -   Is the patient's home free of loose throw rugs in walkways, pet beds, electrical cords, etc?   yes      Grab bars in the bathroom? no      Handrails on the stairs?   no      Adequate lighting?   yes  Timed Get Up and Go Performed: yes, completed within 30 seconds   Depression Screen PHQ 2/9 Scores 04/09/2017 11/25/2016 03/14/2016 04/28/2015  PHQ - 2 Score 0 0 0 0    Cognitive Function     6CIT Screen 04/09/2017  What Year? 0 points  What month? 0 points  What time? 0 points  Count back from 20 0 points  Months in reverse 0 points  Repeat phrase 0 points  Total Score 0    Immunization History  Administered Date(s) Administered  . Influenza,inj,Quad PF,6+ Mos 12/27/2013, 11/25/2016  . Influenza-Unspecified 12/31/2014, 01/08/2016  . Pneumococcal Conjugate-13 12/27/2013  . Pneumococcal Polysaccharide-23 11/22/2011  . Tdap 11/22/2011  . Zoster 02/27/2013    Qualifies for Shingles Vaccine? Zostavax completed 02/27/2013, advised patient to check with pharmacy about receiving the Shingrix vaccine.   Screening Tests Health Maintenance  Topic Date Due  . TETANUS/TDAP  11/21/2021  . COLONOSCOPY  11/17/2023  . INFLUENZA VACCINE  Completed  . Hepatitis C Screening  Completed  . PNA vac Low Risk Adult  Completed   Cancer Screenings: Lung: Low Dose CT Chest  recommended if Age 54-80 years, 30 pack-year currently smoking OR have quit w/in 15years. Patient does not qualify. Colorectal: colonoscopy completed 11/16/2013  Additional Screenings:  Hepatitis B/HIV/Syphillis: HIV completed 03/14/2016, Hep B and Syphillis not indicated  Hepatitis C Screening: completed 03/14/2016    Plan:   I have personally reviewed and noted the following in the patient's chart:   . Medical and social history . Use of alcohol, tobacco or illicit drugs  . Current medications and supplements . Functional ability and status . Nutritional status . Physical activity . Advanced directives . List of other physicians . Hospitalizations, surgeries, and ER visits in previous 12 months . Vitals . Screenings to include cognitive, depression,  and falls . Referrals and appointments  In addition, I have reviewed and discussed with patient certain preventive protocols, quality metrics, and best practice recommendations. A written personalized care plan for preventive services as well as general preventive health recommendations were provided to patient.   1. Hyperlipidemia, unspecified hyperlipidemia type - Lipid panel - Comprehensive metabolic panel - CBC with Differential/Platelet  2. Encounter for Medicare annual wellness exam   Andrez Grime, LPN  08/06/8500

## 2017-04-10 ENCOUNTER — Encounter: Payer: Self-pay | Admitting: Family Medicine

## 2017-04-10 ENCOUNTER — Ambulatory Visit (INDEPENDENT_AMBULATORY_CARE_PROVIDER_SITE_OTHER): Payer: Medicare HMO | Admitting: Family Medicine

## 2017-04-10 ENCOUNTER — Other Ambulatory Visit: Payer: Self-pay

## 2017-04-10 VITALS — BP 128/72 | HR 82 | Temp 98.0°F | Resp 16 | Ht 69.69 in | Wt 172.0 lb

## 2017-04-10 DIAGNOSIS — E785 Hyperlipidemia, unspecified: Secondary | ICD-10-CM | POA: Diagnosis not present

## 2017-04-10 DIAGNOSIS — Z23 Encounter for immunization: Secondary | ICD-10-CM | POA: Diagnosis not present

## 2017-04-10 DIAGNOSIS — Z0000 Encounter for general adult medical examination without abnormal findings: Secondary | ICD-10-CM | POA: Diagnosis not present

## 2017-04-10 LAB — COMPREHENSIVE METABOLIC PANEL
ALBUMIN: 4.2 g/dL (ref 3.5–4.8)
ALK PHOS: 70 IU/L (ref 39–117)
ALT: 23 IU/L (ref 0–44)
AST: 35 IU/L (ref 0–40)
Albumin/Globulin Ratio: 1.8 (ref 1.2–2.2)
BILIRUBIN TOTAL: 0.6 mg/dL (ref 0.0–1.2)
BUN / CREAT RATIO: 10 (ref 10–24)
BUN: 11 mg/dL (ref 8–27)
CHLORIDE: 107 mmol/L — AB (ref 96–106)
CO2: 24 mmol/L (ref 20–29)
Calcium: 9.1 mg/dL (ref 8.6–10.2)
Creatinine, Ser: 1.05 mg/dL (ref 0.76–1.27)
GFR calc Af Amer: 82 mL/min/{1.73_m2} (ref >59)
GFR calc non Af Amer: 71 mL/min/{1.73_m2} (ref >59)
GLUCOSE: 94 mg/dL (ref 65–99)
Globulin, Total: 2.3 g/dL (ref 1.5–4.5)
Potassium: 4.3 mmol/L (ref 3.5–5.2)
Sodium: 143 mmol/L (ref 134–144)
Total Protein: 6.5 g/dL (ref 6.0–8.5)

## 2017-04-10 LAB — CBC WITH DIFFERENTIAL/PLATELET
BASOS ABS: 0 10*3/uL (ref 0.0–0.2)
Basos: 1 %
EOS (ABSOLUTE): 0.2 10*3/uL (ref 0.0–0.4)
Eos: 2 %
HEMOGLOBIN: 16.7 g/dL (ref 13.0–17.7)
Hematocrit: 48.2 % (ref 37.5–51.0)
Immature Grans (Abs): 0 10*3/uL (ref 0.0–0.1)
Immature Granulocytes: 0 %
LYMPHS ABS: 1.7 10*3/uL (ref 0.7–3.1)
Lymphs: 25 %
MCH: 31.7 pg (ref 26.6–33.0)
MCHC: 34.6 g/dL (ref 31.5–35.7)
MCV: 92 fL (ref 79–97)
MONOCYTES: 8 %
MONOS ABS: 0.6 10*3/uL (ref 0.1–0.9)
Neutrophils Absolute: 4.4 10*3/uL (ref 1.4–7.0)
Neutrophils: 64 %
PLATELETS: 258 10*3/uL (ref 150–379)
RBC: 5.27 x10E6/uL (ref 4.14–5.80)
RDW: 14.2 % (ref 12.3–15.4)
WBC: 6.8 10*3/uL (ref 3.4–10.8)

## 2017-04-10 LAB — LIPID PANEL
Chol/HDL Ratio: 2.8 ratio (ref 0.0–5.0)
Cholesterol, Total: 135 mg/dL (ref 100–199)
HDL: 49 mg/dL (ref >39)
LDL Calculated: 73 mg/dL (ref 0–99)
Triglycerides: 63 mg/dL (ref 0–149)
VLDL CHOLESTEROL CAL: 13 mg/dL (ref 5–40)

## 2017-04-10 MED ORDER — ZOSTER VAC RECOMB ADJUVANTED 50 MCG/0.5ML IM SUSR: 0.5000 mL | Freq: Once | INTRAMUSCULAR | 1 refills | Status: DC

## 2017-04-10 MED ORDER — ATORVASTATIN CALCIUM 10 MG PO TABS: 10.0000 mg | ORAL_TABLET | Freq: Every day | ORAL | 3 refills | Status: DC

## 2017-04-10 NOTE — Patient Instructions (Addendum)
Cholesterol looked ok yesterday.  New shingles vaccine sent to your pharmacy.  Thanks for coming in today.   Preventive Care 80 Years and Older, Male Preventive care refers to lifestyle choices and visits with your health care provider that can promote health and wellness. What does preventive care include?  A yearly physical exam. This is also called an annual well check.  Dental exams once or twice a year.  Routine eye exams. Ask your health care provider how often you should have your eyes checked.  Personal lifestyle choices, including: ? Daily care of your teeth and gums. ? Regular physical activity. ? Eating a healthy diet. ? Avoiding tobacco and drug use. ? Limiting alcohol use. ? Practicing safe sex. ? Taking low doses of aspirin every day. ? Taking vitamin and mineral supplements as recommended by your health care provider. What happens during an annual well check? The services and screenings done by your health care provider during your annual well check will depend on your age, overall health, lifestyle risk factors, and family history of disease. Counseling Your health care provider may ask you questions about your:  Alcohol use.  Tobacco use.  Drug use.  Emotional well-being.  Home and relationship well-being.  Sexual activity.  Eating habits.  History of falls.  Memory and ability to understand (cognition).  Work and work Statistician.  Screening You may have the following tests or measurements:  Height, weight, and BMI.  Blood pressure.  Lipid and cholesterol levels. These may be checked every 5 years, or more frequently if you are over 54 years old.  Skin check.  Lung cancer screening. You may have this screening every year starting at age 76 if you have a 30-pack-year history of smoking and currently smoke or have quit within the past 15 years.  Fecal occult blood test (FOBT) of the stool. You may have this test every year starting at age  64.  Flexible sigmoidoscopy or colonoscopy. You may have a sigmoidoscopy every 5 years or a colonoscopy every 10 years starting at age 24.  Prostate cancer screening. Recommendations will vary depending on your family history and other risks.  Hepatitis C blood test.  Hepatitis B blood test.  Sexually transmitted disease (STD) testing.  Diabetes screening. This is done by checking your blood sugar (glucose) after you have not eaten for a while (fasting). You may have this done every 1-3 years.  Abdominal aortic aneurysm (AAA) screening. You may need this if you are a current or former smoker.  Osteoporosis. You may be screened starting at age 39 if you are at high risk.  Talk with your health care provider about your test results, treatment options, and if necessary, the need for more tests. Vaccines Your health care provider may recommend certain vaccines, such as:  Influenza vaccine. This is recommended every year.  Tetanus, diphtheria, and acellular pertussis (Tdap, Td) vaccine. You may need a Td booster every 10 years.  Varicella vaccine. You may need this if you have not been vaccinated.  Zoster vaccine. You may need this after age 62.  Measles, mumps, and rubella (MMR) vaccine. You may need at least one dose of MMR if you were born in 1957 or later. You may also need a second dose.  Pneumococcal 13-valent conjugate (PCV13) vaccine. One dose is recommended after age 64.  Pneumococcal polysaccharide (PPSV23) vaccine. One dose is recommended after age 70.  Meningococcal vaccine. You may need this if you have certain conditions.  Hepatitis A  vaccine. You may need this if you have certain conditions or if you travel or work in places where you may be exposed to hepatitis A.  Hepatitis B vaccine. You may need this if you have certain conditions or if you travel or work in places where you may be exposed to hepatitis B.  Haemophilus influenzae type b (Hib) vaccine. You may  need this if you have certain risk factors.  Talk to your health care provider about which screenings and vaccines you need and how often you need them. This information is not intended to replace advice given to you by your health care provider. Make sure you discuss any questions you have with your health care provider. Document Released: 04/14/2015 Document Revised: 12/06/2015 Document Reviewed: 01/17/2015 Elsevier Interactive Patient Education  2018 [employer].     IF you received an x-ray today, you will receive an invoice from [facility]. Please contact [facility] at [phone] with questions or concerns regarding your invoice.   IF you received labwork today, you will receive an invoice from [facility]. Please contact [facility] at [phone] with questions or concerns regarding your invoice.   Our billing staff will not be able to assist you with questions regarding bills from these companies.  You will be contacted with the lab results as soon as they are available. The fastest way to get your results is to activate your My Chart account. Instructions are located on the last page of this paperwork. If you have not heard from [place] regarding the results in 2 weeks, please contact this office.

## 2017-04-10 NOTE — Progress Notes (Signed)
Subjective:  By signing my name below, I, Moises Blood, attest that this documentation has been prepared under the direction and in the presence of [name], MD.  Electronically Signed: Moises Blood, [place]. 04/10/2017 , 9:33 AM .  Patient was seen in Room 10 .   Patient ID: Jesse Riddle, male    DOB: [birth year], 72 y.o.   MRN: [id number] Chief Complaint  Patient presents with  . Annual Exam   HPI Jesse Riddle is a 72 y.o. male Here for annual physical and follow up of hyperlipidemia. Annual wellness visit done yesterday; no concerning responses at [facility] yesterday.   Hyperlipidemia Lab Results  Component Value Date   CHOL 135 04/09/2017   HDL 49 04/09/2017   LDLCALC 73 04/09/2017   TRIG 63 04/09/2017   CHOLHDL 2.8 04/09/2017   Lab Results  Component Value Date   ALT 23 04/09/2017   AST 35 04/09/2017   ALKPHOS 70 04/09/2017   BILITOT 0.6 04/09/2017   He takes Lipitor 77m QD, aspirin 848mand OTC fish oil.   Cancer Screening Colonoscopy: last done in Aug 2015.  Prostate cancer screening:  Lab Results  Component Value Date   PSA 3.16 03/13/2015   PSA 2.49 12/27/2013   PSA 2.51 12/21/2012    Immunizations Immunization History  Administered Date(s) Administered  . Influenza,inj,Quad PF,6+ Mos 12/27/2013, 11/25/2016  . Influenza-Unspecified 12/31/2014, 01/08/2016  . Pneumococcal Conjugate-13 12/27/2013  . Pneumococcal Polysaccharide-23 11/22/2011  . Tdap 11/22/2011  . Zoster 02/27/2013   Shingles vaccination: He would like to update for Shingrix; sent in to pharmacy.   [facility] 04/10/2017 04/10/2017 04/09/2017 11/25/2016 03/14/2016  Decreased Interest 0 0 0 0 0  Down, Depressed, Hopeless 0 0 0 0 0  PHQ - 2 Score 0 0 0 0 0    Vision  Visual Acuity Screening   Right eye Left eye Both eyes  Without correction: '20/20 20/20 20/20 '  With correction:       Exercise He reported 25 minutes of exercise, 3 times a week, with  plan on increase when discussed yesterday. He denies chest pain, chest tightness, shortness of breath, lightheadedness or dizziness.   Home Exercise  04/09/2017  Current Exercise Habits Home exercise routine  Type of exercise walking  Time (Minutes) 25  Frequency (Times/Week) 3  Weekly Exercise (Minutes/Week) 75  Intensity Mild  Exercise limited by: None identified    Functional Status Survey Is the patient deaf or have difficulty hearing?: No Does the patient have difficulty seeing, even when wearing glasses/contacts?: No Does the patient have difficulty concentrating, remembering, or making decisions?: No Does the patient have difficulty walking or climbing stairs?: No Does the patient have difficulty dressing or bathing?: No Does the patient have difficulty doing errands alone such as visiting a doctor's office or shopping?: No  Normal yesterday and today.   Mental Status Survey 6CIT Screen 04/09/2017  What Year? 0 points  What month? 0 points  What time? 0 points  Count back from 20 0 points  Months in reverse 0 points  Repeat phrase 0 points  Total Score 0   Normal yesterday.   Advanced Directives He has a living will and healthcare power of attorney.   There are no active problems to display for this patient.  History reviewed. No pertinent past medical history. History reviewed. No pertinent surgical history. Allergies  Allergen Reactions  . Penicillins Other (See Comments)    child   Prior to  Admission medications   Medication Sig Start Date End Date Taking? Authorizing Provider  aspirin 81 MG tablet Take 81 mg by mouth daily.    [provider]  atorvastatin (LIPITOR) 10 MG tablet Take 1 tablet (10 mg total) by mouth daily. 01/21/17   Wendie Agreste, MD  fish oil-omega-3 fatty acids 1000 MG capsule Take 1 g by mouth daily.    [provider]  OVER THE COUNTER MEDICATION Saw [place]metto taking daily    [provider]   Social  History   Socioeconomic History  . Marital status: Married    Spouse name: Not on file  . Number of children: 3  . Years of education: Not on file  . Highest education level: Bachelor's degree (e.g., BA, AB, BS)  Social Needs  . Financial resource strain: Not hard at all  . Food insecurity - worry: Never true  . Food insecurity - inability: Never true  . Transportation needs - medical: No  . Transportation needs - non-medical: No  Occupational History  . Occupation: Retired    Fish farm manager: Retail banker  Tobacco Use  . Smoking status: Never Smoker  . Smokeless tobacco: Never Used  Substance and Sexual Activity  . Alcohol use: Yes    Alcohol/week: 0.0 oz    Comment: Occasional  . Drug use: No  . Sexual activity: Yes  Other Topics Concern  . Not on file  Social History Narrative   Married. Education: [employer]. Exercise: walk/bike 3 times a week for 30 minutes.   Review of Systems 13 point ROS - negative     Objective:   Physical Exam  Constitutional: He is oriented to person, place, and time. He appears well-developed and well-nourished.  HENT:  Head: Normocephalic and atraumatic.  Right Ear: External ear normal.  Left Ear: External ear normal.  Mouth/Throat: Oropharynx is clear and moist.  Eyes: Conjunctivae and EOM are normal. Pupils are equal, round, and reactive to light.  Neck: Normal range of motion. Neck supple. No thyromegaly present.  Cardiovascular: Normal rate, regular rhythm, normal heart sounds and intact distal pulses.  Pulmonary/Chest: Effort normal and breath sounds normal. No respiratory distress. He has no wheezes.  Abdominal: Soft. He exhibits no distension. There is no tenderness.  Musculoskeletal: Normal range of motion. He exhibits no edema or tenderness.  Lymphadenopathy:    He has no cervical adenopathy.  Neurological: He is alert and oriented to person, place, and time. He has normal reflexes.  Skin: Skin is warm and dry.  Psychiatric: He has  a normal mood and affect. His behavior is normal.  Vitals reviewed.    Vitals:   04/10/17 0907  BP: 128/72  Pulse: 82  Resp: 16  Temp: 98 F (36.7 C)  TempSrc: Oral  SpO2: 95%  Weight: 172 lb (78 kg)  Height: 5' 9.69" (1.77 m)      Assessment & Plan:    Jesse Riddle is a 72 y.o. male Annual physical exam  - -anticipatory guidance as below in AVS, screening labs above. Health maintenance items as above in HPI discussed/recommended as applicable.   - Prostate testing declined at this time. Other components of health discussed at annual wellness visit yesterday.  Need for shingles vaccine - Plan: Zoster Vaccine Adjuvanted [facility]) injection  -Shingles vaccine sent to pharmacy. Status post Zostavax, but will prescribe Shingrix.   Hyperlipidemia, unspecified hyperlipidemia type - Plan: atorvastatin (LIPITOR) 10 MG tablet  -Well-controlled based on labs yesterday. Continue Lipitor same dose.  Recheck 1 year for physical/wellness visit  Meds ordered this encounter  Medications  . Zoster Vaccine Adjuvanted [facility]) injection    Sig: Inject 0.5 mLs into the muscle once for 1 dose. Repeat in 2-6 months.    Dispense:  0.5 mL    Refill:  1  . atorvastatin (LIPITOR) 10 MG tablet    Sig: Take 1 tablet (10 mg total) by mouth daily.    Dispense:  90 tablet    Refill:  3   Patient Instructions   Cholesterol looked ok yesterday.  New shingles vaccine sent to your pharmacy.  Thanks for coming in today.   Preventive Care 89 Years and Older, Male Preventive care refers to lifestyle choices and visits with your health care provider that can promote health and wellness. What does preventive care include?  A yearly physical exam. This is also called an annual well check.  Dental exams once or twice a year.  Routine eye exams. Ask your health care provider how often you should have your eyes checked.  Personal lifestyle choices, including: ? Daily care of your teeth and  gums. ? Regular physical activity. ? Eating a healthy diet. ? Avoiding tobacco and drug use. ? Limiting alcohol use. ? Practicing safe sex. ? Taking low doses of aspirin every day. ? Taking vitamin and mineral supplements as recommended by your health care provider. What happens during an annual well check? The services and screenings done by your health care provider during your annual well check will depend on your age, overall health, lifestyle risk factors, and family history of disease. Counseling Your health care provider may ask you questions about your:  Alcohol use.  Tobacco use.  Drug use.  Emotional well-being.  Home and relationship well-being.  Sexual activity.  Eating habits.  History of falls.  Memory and ability to understand (cognition).  Work and work Statistician.  Screening You may have the following tests or measurements:  Height, weight, and BMI.  Blood pressure.  Lipid and cholesterol levels. These may be checked every 5 years, or more frequently if you are over 73 years old.  Skin check.  Lung cancer screening. You may have this screening every year starting at age 24 if you have a 30-pack-year history of smoking and currently smoke or have quit within the past 15 years.  Fecal occult blood test (FOBT) of the stool. You may have this test every year starting at age 23.  Flexible sigmoidoscopy or colonoscopy. You may have a sigmoidoscopy every 5 years or a colonoscopy every 10 years starting at age 95.  Prostate cancer screening. Recommendations will vary depending on your family history and other risks.  Hepatitis C blood test.  Hepatitis B blood test.  Sexually transmitted disease (STD) testing.  Diabetes screening. This is done by checking your blood sugar (glucose) after you have not eaten for a while (fasting). You may have this done every 1-3 years.  Abdominal aortic aneurysm (AAA) screening. You may need this if you are a  current or former smoker.  Osteoporosis. You may be screened starting at age 23 if you are at high risk.  Talk with your health care provider about your test results, treatment options, and if necessary, the need for more tests. Vaccines Your health care provider may recommend certain vaccines, such as:  Influenza vaccine. This is recommended every year.  Tetanus, diphtheria, and acellular pertussis (Tdap, Td) vaccine. You may need a Td booster every 10 years.  Varicella vaccine.  You may need this if you have not been vaccinated.  Zoster vaccine. You may need this after age 37.  Measles, mumps, and rubella (MMR) vaccine. You may need at least one dose of MMR if you were born in 1957 or later. You may also need a second dose.  Pneumococcal 13-valent conjugate (PCV13) vaccine. One dose is recommended after age 19.  Pneumococcal polysaccharide (PPSV23) vaccine. One dose is recommended after age 71.  Meningococcal vaccine. You may need this if you have certain conditions.  Hepatitis A vaccine. You may need this if you have certain conditions or if you travel or work in places where you may be exposed to hepatitis A.  Hepatitis B vaccine. You may need this if you have certain conditions or if you travel or work in places where you may be exposed to hepatitis B.  Haemophilus influenzae type b (Hib) vaccine. You may need this if you have certain risk factors.  Talk to your health care provider about which screenings and vaccines you need and how often you need them. This information is not intended to replace advice given to you by your health care provider. Make sure you discuss any questions you have with your health care provider. Document Released: 04/14/2015 Document Revised: 12/06/2015 Document Reviewed: 01/17/2015 Elsevier Interactive Patient Education  2018 [employer].     IF you received an x-ray today, you will receive an invoice from [facility]. Please contact  [facility] at [phone] with questions or concerns regarding your invoice.   IF you received labwork today, you will receive an invoice from [facility]. Please contact [facility] at [phone] with questions or concerns regarding your invoice.   Our billing staff will not be able to assist you with questions regarding bills from these companies.  You will be contacted with the lab results as soon as they are available. The fastest way to get your results is to activate your My Chart account. Instructions are located on the last page of this paperwork. If you have not heard from [place] regarding the results in 2 weeks, please contact this office.       I personally performed the services described in this documentation, which was scribed in my presence. The recorded information has been reviewed and considered for accuracy and completeness, addended by me as needed, and agree with information above.  Signed,   [name], MD [facility].  04/10/17 9:44 AM

## 2017-05-07 DIAGNOSIS — H524 Presbyopia: Secondary | ICD-10-CM | POA: Diagnosis not present

## 2017-05-07 DIAGNOSIS — H2512 Age-related nuclear cataract, left eye: Secondary | ICD-10-CM | POA: Diagnosis not present

## 2017-05-07 DIAGNOSIS — H5202 Hypermetropia, left eye: Secondary | ICD-10-CM | POA: Diagnosis not present

## 2017-05-07 DIAGNOSIS — H43813 Vitreous degeneration, bilateral: Secondary | ICD-10-CM | POA: Diagnosis not present

## 2017-05-16 ENCOUNTER — Other Ambulatory Visit: Payer: Self-pay | Admitting: Family Medicine

## 2017-05-16 DIAGNOSIS — E785 Hyperlipidemia, unspecified: Secondary | ICD-10-CM

## 2017-06-17 DIAGNOSIS — R69 Illness, unspecified: Secondary | ICD-10-CM | POA: Diagnosis not present

## 2017-07-23 DIAGNOSIS — Z8249 Family history of ischemic heart disease and other diseases of the circulatory system: Secondary | ICD-10-CM | POA: Diagnosis not present

## 2017-07-23 DIAGNOSIS — Z823 Family history of stroke: Secondary | ICD-10-CM | POA: Diagnosis not present

## 2017-07-23 DIAGNOSIS — E785 Hyperlipidemia, unspecified: Secondary | ICD-10-CM | POA: Diagnosis not present

## 2017-08-31 ENCOUNTER — Telehealth: Payer: Self-pay | Admitting: Family Medicine

## 2017-08-31 DIAGNOSIS — E785 Hyperlipidemia, unspecified: Secondary | ICD-10-CM

## 2017-09-02 ENCOUNTER — Other Ambulatory Visit: Payer: Self-pay

## 2017-09-02 DIAGNOSIS — E785 Hyperlipidemia, unspecified: Secondary | ICD-10-CM

## 2017-09-02 MED ORDER — ATORVASTATIN CALCIUM 10 MG PO TABS: 10.0000 mg | ORAL_TABLET | Freq: Every day | ORAL | 2 refills | Status: DC

## 2017-09-02 NOTE — Telephone Encounter (Signed)
Pt called he would like to have call about why med was denied.  Pt would like to have entire year sent in. cb is [phone] Please call back

## 2017-09-02 NOTE — Telephone Encounter (Signed)
Refill done. Pt advised.

## 2017-09-04 DIAGNOSIS — Z0100 Encounter for examination of eyes and vision without abnormal findings: Secondary | ICD-10-CM | POA: Diagnosis not present

## 2017-10-15 DIAGNOSIS — R69 Illness, unspecified: Secondary | ICD-10-CM | POA: Diagnosis not present

## 2017-10-23 DIAGNOSIS — R69 Illness, unspecified: Secondary | ICD-10-CM | POA: Diagnosis not present

## 2017-12-29 DIAGNOSIS — R69 Illness, unspecified: Secondary | ICD-10-CM | POA: Diagnosis not present

## 2018-01-15 DIAGNOSIS — R69 Illness, unspecified: Secondary | ICD-10-CM | POA: Diagnosis not present

## 2018-04-03 ENCOUNTER — Telehealth: Payer: Self-pay | Admitting: Family Medicine

## 2018-04-03 DIAGNOSIS — E785 Hyperlipidemia, unspecified: Secondary | ICD-10-CM

## 2018-04-03 MED ORDER — ATORVASTATIN CALCIUM 10 MG PO TABS: 10.0000 mg | ORAL_TABLET | Freq: Every day | ORAL | 2 refills | Status: DC

## 2018-04-03 NOTE — Telephone Encounter (Signed)
Called pharmacy at the number provided by patient and was given a different number of [phone] to office in [facility] that does mail order / Called to see if they take electronic requests or does the request need to be faxed? / Spoke with [facility] who told me the correct pharmacy is in [place], [place], and the prescription can be sent electronically. / Pharmacy updated and medication sent.

## 2018-04-03 NOTE — Telephone Encounter (Signed)
Patient calling back to give updated fax information for [facility].   Correct fax # [phone]

## 2018-04-03 NOTE — Telephone Encounter (Signed)
Copied from [place] [phone]. Topic: Quick Communication - See Telephone Encounter >> Apr 03, 2018  2:51 PM Conception Chancy, NT wrote: CRM for notification. See Telephone encounter for: 04/03/18.   Patient is calling and requesting a refill on atorvastatin (LIPITOR) 10 MG tablet.   [facility]   CB# [phone] CB# [phone]

## 2018-04-10 ENCOUNTER — Ambulatory Visit: Payer: Self-pay

## 2018-04-10 NOTE — Telephone Encounter (Signed)
Patient is calling in wanting to know what the price would be for med to be filled at [place]. Please advise.

## 2018-04-13 ENCOUNTER — Ambulatory Visit (INDEPENDENT_AMBULATORY_CARE_PROVIDER_SITE_OTHER): Payer: PPO | Admitting: Family Medicine

## 2018-04-13 ENCOUNTER — Encounter: Payer: Self-pay | Admitting: Family Medicine

## 2018-04-13 ENCOUNTER — Other Ambulatory Visit: Payer: Self-pay

## 2018-04-13 VITALS — BP 140/83 | HR 65 | Temp 98.6°F | Resp 14 | Ht 70.5 in | Wt 171.0 lb

## 2018-04-13 VITALS — BP 140/83 | Ht 69.75 in | Wt 171.0 lb

## 2018-04-13 DIAGNOSIS — Z1329 Encounter for screening for other suspected endocrine disorder: Secondary | ICD-10-CM | POA: Diagnosis not present

## 2018-04-13 DIAGNOSIS — Z0000 Encounter for general adult medical examination without abnormal findings: Secondary | ICD-10-CM | POA: Diagnosis not present

## 2018-04-13 DIAGNOSIS — Z125 Encounter for screening for malignant neoplasm of prostate: Secondary | ICD-10-CM | POA: Diagnosis not present

## 2018-04-13 DIAGNOSIS — E785 Hyperlipidemia, unspecified: Secondary | ICD-10-CM | POA: Diagnosis not present

## 2018-04-13 DIAGNOSIS — Z23 Encounter for immunization: Secondary | ICD-10-CM | POA: Diagnosis not present

## 2018-04-13 DIAGNOSIS — Z0001 Encounter for general adult medical examination with abnormal findings: Secondary | ICD-10-CM | POA: Diagnosis not present

## 2018-04-13 MED ORDER — ATORVASTATIN CALCIUM 10 MG PO TABS: 10.0000 mg | ORAL_TABLET | Freq: Every day | ORAL | 3 refills | Status: DC

## 2018-04-13 MED ORDER — ZOSTER VAC RECOMB ADJUVANTED 50 MCG/0.5ML IM SUSR: 0.5000 mL | Freq: Once | INTRAMUSCULAR | 1 refills | Status: AC

## 2018-04-13 NOTE — Patient Instructions (Addendum)
No change in meds for now. Thanks for coming in today.   Preventive Care 67 Years and Older, Male Preventive care refers to lifestyle choices and visits with your health care provider that can promote health and wellness. What does preventive care include?   A yearly physical exam. This is also called an annual well check.  Dental exams once or twice a year.  Routine eye exams. Ask your health care provider how often you should have your eyes checked.  Personal lifestyle choices, including: ? Daily care of your teeth and gums. ? Regular physical activity. ? Eating a healthy diet. ? Avoiding tobacco and drug use. ? Limiting alcohol use. ? Practicing safe sex. ? Taking low doses of aspirin every day. ? Taking vitamin and mineral supplements as recommended by your health care provider. What happens during an annual well check? The services and screenings done by your health care provider during your annual well check will depend on your age, overall health, lifestyle risk factors, and family history of disease. Counseling Your health care provider may ask you questions about your:  Alcohol use.  Tobacco use.  Drug use.  Emotional well-being.  Home and relationship well-being.  Sexual activity.  Eating habits.  History of falls.  Memory and ability to understand (cognition).  Work and work Statistician. Screening You may have the following tests or measurements:  Height, weight, and BMI.  Blood pressure.  Lipid and cholesterol levels. These may be checked every 5 years, or more frequently if you are over 78 years old.  Skin check.  Lung cancer screening. You may have this screening every year starting at age 52 if you have a 30-pack-year history of smoking and currently smoke or have quit within the past 15 years.  Colorectal cancer screening. All adults should have this screening starting at age 32 and continuing until age 27. You will have tests every 1-10  years, depending on your results and the type of screening test. People at increased risk should start screening at an earlier age. Screening tests may include: ? Guaiac-based fecal occult blood testing. ? Fecal immunochemical test (FIT). ? Stool DNA test. ? Virtual colonoscopy. ? Sigmoidoscopy. During this test, a flexible tube with a tiny camera (sigmoidoscope) is used to examine your rectum and lower colon. The sigmoidoscope is inserted through your anus into your rectum and lower colon. ? Colonoscopy. During this test, a long, thin, flexible tube with a tiny camera (colonoscope) is used to examine your entire colon and rectum.  Prostate cancer screening. Recommendations will vary depending on your family history and other risks.  Hepatitis C blood test.  Hepatitis B blood test.  Sexually transmitted disease (STD) testing.  Diabetes screening. This is done by checking your blood sugar (glucose) after you have not eaten for a while (fasting). You may have this done every 1-3 years.  Abdominal aortic aneurysm (AAA) screening. You may need this if you are a current or former smoker.  Osteoporosis. You may be screened starting at age 28 if you are at high risk. Talk with your health care provider about your test results, treatment options, and if necessary, the need for more tests. Vaccines Your health care provider may recommend certain vaccines, such as:  Influenza vaccine. This is recommended every year.  Tetanus, diphtheria, and acellular pertussis (Tdap, Td) vaccine. You may need a Td booster every 10 years.  Varicella vaccine. You may need this if you have not been vaccinated.  Zoster vaccine. You  may need this after age 12.  Measles, mumps, and rubella (MMR) vaccine. You may need at least one dose of MMR if you were born in 1957 or later. You may also need a second dose.  Pneumococcal 13-valent conjugate (PCV13) vaccine. One dose is recommended after age 32.  Pneumococcal  polysaccharide (PPSV23) vaccine. One dose is recommended after age 75.  Meningococcal vaccine. You may need this if you have certain conditions.  Hepatitis A vaccine. You may need this if you have certain conditions or if you travel or work in places where you may be exposed to hepatitis A.  Hepatitis B vaccine. You may need this if you have certain conditions or if you travel or work in places where you may be exposed to hepatitis B.  Haemophilus influenzae type b (Hib) vaccine. You may need this if you have certain risk factors. Talk to your health care provider about which screenings and vaccines you need and how often you need them. This information is not intended to replace advice given to you by your health care provider. Make sure you discuss any questions you have with your health care provider. Document Released: 04/14/2015 Document Revised: 05/08/2017 Document Reviewed: 01/17/2015 Elsevier Interactive Patient Education  [employer].    If you have lab work done today you will be contacted with your lab results within the next 2 weeks.  If you have not heard from [place] then please contact us. The fastest way to get your results is to register for My Chart.   IF you received an x-ray today, you will receive an invoice from [facility]. Please contact [facility] at [phone] with questions or concerns regarding your invoice.   IF you received labwork today, you will receive an invoice from [facility]. Please contact [facility] at [phone] with questions or concerns regarding your invoice.   Our billing staff will not be able to assist you with questions regarding bills from these companies.  You will be contacted with the lab results as soon as they are available. The fastest way to get your results is to activate your My Chart account. Instructions are located on the last page of this paperwork. If you have not heard from [place] regarding the results in 2  weeks, please contact this office.

## 2018-04-13 NOTE — Progress Notes (Addendum)
Presents today for Medicare Annual Wellness Visit    Patient Care Team: Wendie Agreste, MD as PCP - General (Family Medicine)   Immunization status:  Immunization History  Administered Date(s) Administered  . Influenza,inj,Quad PF,6+ Mos 12/27/2013, 11/25/2016  . Influenza-Unspecified 12/31/2014, 01/08/2016  . Pneumococcal Conjugate-13 12/27/2013  . Pneumococcal Polysaccharide-23 11/22/2011  . Tdap 11/22/2011  . Zoster 02/27/2013     There are no preventive care reminders to display for this patient.   Functional Status Survey: Is the patient deaf or have difficulty hearing?: No Does the patient have difficulty seeing, even when wearing glasses/contacts?: No Does the patient have difficulty concentrating, remembering, or making decisions?: No Does the patient have difficulty walking or climbing stairs?: No Does the patient have difficulty dressing or bathing?: No Does the patient have difficulty doing errands alone such as visiting a doctor's office or shopping?: No   6CIT Screen 04/13/2018 04/13/2018 04/09/2017  What Year? 0 points 0 points 0 points  What month? 0 points 0 points 0 points  What time? 0 points 0 points 0 points  Count back from 20 0 points 0 points 0 points  Months in reverse 0 points 0 points 0 points  Repeat phrase 0 points 0 points 0 points  Total Score 0 0 0        Office Visit from 04/13/2018 in [facility] at [facility]  AUDIT-C Score  3       There are no active problems to display for this patient.    Past Medical History:  Diagnosis Date  . Allergy    penicillin     History reviewed. No pertinent surgical history.   Family History  Problem Relation Age of Onset  . Stroke Mother   . Mental illness Father   . Heart disease Brother   . Heart disease Brother      Social History   Socioeconomic History  . Marital status: Married    Spouse name: Not on file  . Number of children: 3  . Years of education: Not on file   . Highest education level: Bachelor's degree (e.g., BA, AB, BS)  Occupational History  . Occupation: Retired    Fish farm manager: [employer]  Social Needs  . Financial resource strain: Not hard at all  . Food insecurity:    Worry: Never true    Inability: Never true  . Transportation needs:    Medical: No    Non-medical: No  Tobacco Use  . Smoking status: Never Smoker  . Smokeless tobacco: Never Used  Substance and Sexual Activity  . Alcohol use: Yes    Alcohol/week: 0.0 standard drinks    Comment: Occasional  . Drug use: No  . Sexual activity: Yes  Lifestyle  . Physical activity:    Days per week: 3 days    Minutes per session: 20 min  . Stress: Not at all  Relationships  . Social connections:    Talks on phone: More than three times a week    Gets together: More than three times a week    Attends religious service: More than 4 times per year    Active member of club or organization: No    Attends meetings of clubs or organizations: Never    Relationship status: Married  . Intimate partner violence:    Fear of current or ex partner: No    Emotionally abused: No    Physically abused: No    Forced sexual activity: No  Other Topics Concern  . Not on file  Social History Narrative   Married. Education: [employer]. Exercise: walk/bike 3 times a week for 30 minutes.     Allergies  Allergen Reactions  . Penicillins Other (See Comments)    child     Prior to Admission medications   Medication Sig Start Date End Date Taking? Authorizing Provider  aspirin 81 MG tablet Take 81 mg by mouth daily.   Yes [provider]  fish oil-omega-3 fatty acids 1000 MG capsule Take 1 g by mouth daily.   Yes [provider]  OVER THE COUNTER MEDICATION [place] taking daily   Yes [provider]  atorvastatin (LIPITOR) 10 MG tablet Take 1 tablet (10 mg total) by mouth daily at 6 PM. 04/13/18   Wendie Agreste, MD     Depression screen [facility] 2/9 04/13/2018  04/13/2018 04/10/2017 04/10/2017 04/09/2017  Decreased Interest 0 0 0 0 0  Down, Depressed, Hopeless 0 0 0 0 0  PHQ - 2 Score 0 0 0 0 0     Fall Risk  04/13/2018 04/13/2018 04/13/2018 04/10/2017 04/10/2017  Falls in the past year? 0 0 0 No No  Number falls in past yr: - 0 0 - -  Injury with Fall? - 0 0 - -  Risk for fall due to : - - - - -  Risk for fall due to: Comment - - - - -      PHYSICAL EXAM: BP 140/83 (BP Location: Right Arm, Patient Position: Sitting, Cuff Size: Normal)   Ht 5' 9.75" (1.772 m)   Wt 171 lb (77.6 kg)   BMI 24.71 kg/m    Wt Readings from Last 3 Encounters:  04/13/18 171 lb (77.6 kg)  04/13/18 171 lb (77.6 kg)  04/10/17 172 lb (78 kg)     No exam data present    Physical Exam   Education/Counseling provided regarding diet and exercise, prevention of chronic diseases, smoking/tobacco cessation, if applicable, and reviewed "Covered Medicare Preventive Services."   ASSESSMENT/PLAN: 1. 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  Very busy.  Breakfast: yogurt shake   Lunch: light sandwhich Dinner: chicken, vegetables  Last Eye exam 1 year has appointment next month  Dr. Satira Sark  Colonoscopy up to date Has had one Shingrix thinking  Exercise with Silver Sneakers 3 times a week.

## 2018-04-13 NOTE — Progress Notes (Signed)
Subjective:    Patient ID: Jesse Riddle, male    DOB: [birth year], 73 y.o.   MRN: [id number]  HPI Jesse Riddle is a 73 y.o. male Presents today for: Chief Complaint  Patient presents with  . Medicare Wellness  Medicare wellness exam earlier today.  Here for physical, med review.  Has follow up with ortho - Dr. Wynelle Link for hip and knee pains.    Hyperlipidemia:  Lab Results  Component Value Date   CHOL 135 04/09/2017   HDL 49 04/09/2017   LDLCALC 73 04/09/2017   TRIG 63 04/09/2017   CHOLHDL 2.8 04/09/2017   Lab Results  Component Value Date   ALT 23 04/09/2017   AST 35 04/09/2017   ALKPHOS 70 04/09/2017   BILITOT 0.6 04/09/2017  Lipitor 10 mg daily. No new myalgias, rare missed dose every week or two.   Cancer screening: Colonoscopy 11/16/2013, repeat 10 years. Prostate: PSA 2.9 on 05/03/2016. Agrees to testing.   Immunization History  Administered Date(s) Administered  . Influenza,inj,Quad PF,6+ Mos 12/27/2013, 11/25/2016  . Influenza-Unspecified 12/31/2014, 01/08/2016  . Pneumococcal Conjugate-13 12/27/2013  . Pneumococcal Polysaccharide-23 11/22/2011  . Tdap 11/22/2011  . Zoster 02/27/2013  shingrix: has not had.   Depression screen [facility] 2/9 04/13/2018 04/13/2018 04/10/2017 04/10/2017 04/09/2017  Decreased Interest 0 0 0 0 0  Down, Depressed, Hopeless 0 0 0 0 0  PHQ - 2 Score 0 0 0 0 0    Functional Status Survey: Is the patient deaf or have difficulty hearing?: No Does the patient have difficulty seeing, even when wearing glasses/contacts?: No Does the patient have difficulty concentrating, remembering, or making decisions?: No Does the patient have difficulty walking or climbing stairs?: No Does the patient have difficulty dressing or bathing?: No Does the patient have difficulty doing errands alone such as visiting a doctor's office or shopping?: No   Memory 6CIT Screen 04/13/2018 04/13/2018 04/09/2017  What Year? 0 points 0 points 0 points  What month? 0  points 0 points 0 points  What time? 0 points 0 points 0 points  Count back from 20 0 points 0 points 0 points  Months in reverse 0 points 0 points 0 points  Repeat phrase 0 points 0 points 0 points  Total Score 0 0 0    Visual Acuity Screening   Right eye Left eye Both eyes  Without correction: '20/15 20/50 20/20 '  With correction:     appt with optho in Feb.   Dentist: every 6 months.  Exercise: 2-3 times per week.   Advanced directives: Living will, healthcare power of attorney.   There are no active problems to display for this patient.  Past Medical History:  Diagnosis Date  . Allergy    penicillin   No past surgical history on file. Allergies  Allergen Reactions  . Penicillins Other (See Comments)    child   Prior to Admission medications   Medication Sig Start Date End Date Taking? Authorizing Provider  aspirin 81 MG tablet Take 81 mg by mouth daily.   Yes [provider]  atorvastatin (LIPITOR) 10 MG tablet Take 1 tablet (10 mg total) by mouth daily. 04/03/18  Yes Wendie Agreste, MD  fish oil-omega-3 fatty acids 1000 MG capsule Take 1 g by mouth daily.   Yes [provider]  [place] taking daily   Yes [provider]   Social History   Socioeconomic History  . Marital status: Married  Spouse name: Not on file  . Number of children: 3  . Years of education: Not on file  . Highest education level: Bachelor's degree (e.g., BA, AB, BS)  Occupational History  . Occupation: Retired    Fish farm manager: [employer]  Social Needs  . Financial resource strain: Not hard at all  . Food insecurity:    Worry: Never true    Inability: Never true  . Transportation needs:    Medical: No    Non-medical: No  Tobacco Use  . Smoking status: Never Smoker  . Smokeless tobacco: Never Used  Substance and Sexual Activity  . Alcohol use: Yes    Alcohol/week: 0.0 standard drinks    Comment: Occasional  . Drug  use: No  . Sexual activity: Yes  Lifestyle  . Physical activity:    Days per week: 3 days    Minutes per session: 20 min  . Stress: Not at all  Relationships  . Social connections:    Talks on phone: More than three times a week    Gets together: More than three times a week    Attends religious service: More than 4 times per year    Active member of club or organization: No    Attends meetings of clubs or organizations: Never    Relationship status: Married  . Intimate partner violence:    Fear of current or ex partner: No    Emotionally abused: No    Physically abused: No    Forced sexual activity: No  Other Topics Concern  . Not on file  Social History Narrative   Married. Education: [employer]. Exercise: walk/bike 3 times a week for 30 minutes.    Review of Systems     Objective:   Physical Exam Vitals signs reviewed.  Constitutional:      Appearance: He is well-developed.  HENT:     Head: Normocephalic and atraumatic.     Right Ear: External ear normal.     Left Ear: External ear normal.  Eyes:     Conjunctiva/sclera: Conjunctivae normal.     Pupils: Pupils are equal, round, and reactive to light.  Neck:     Musculoskeletal: Normal range of motion and neck supple.     Thyroid: No thyromegaly.  Cardiovascular:     Rate and Rhythm: Normal rate and regular rhythm.     Heart sounds: Normal heart sounds.  Pulmonary:     Effort: Pulmonary effort is normal. No respiratory distress.     Breath sounds: Normal breath sounds. No wheezing.  Abdominal:     General: There is no distension.     Palpations: Abdomen is soft.     Tenderness: There is no abdominal tenderness.     Hernia: There is no hernia in the right inguinal area or left inguinal area.  Genitourinary:    Comments: Prostate firm, but no apparent isolated nodule.  Musculoskeletal: Normal range of motion.        General: No tenderness.  Lymphadenopathy:     Cervical: No cervical adenopathy.  Skin:     General: Skin is warm and dry.  Neurological:     Mental Status: He is alert and oriented to person, place, and time.     Deep Tendon Reflexes: Reflexes are normal and symmetric.  Psychiatric:        Behavior: Behavior normal.    Vitals:   04/13/18 1344  BP: 140/83  Pulse: 65  Resp: 14  Temp: 98.6 F (37 C)  TempSrc: Oral  SpO2: 98%  Weight: 171 lb (77.6 kg)  Height: 5' 10.5" (1.791 m)      Assessment & Plan:    Jesse Riddle is a 73 y.o. male Annual physical exam  - -anticipatory guidance as below in AVS, Health maintenance items as above in HPI discussed/recommended as applicable.   Need for shingles vaccine - Plan: Zoster Vaccine Adjuvanted [facility]) injection to pharmacy.   Hyperlipidemia, unspecified hyperlipidemia type - Plan: Lipid Panel, Comprehensive metabolic panel, DISCONTINUED: atorvastatin (LIPITOR) 10 MG tablet  -  Stable, tolerating current regimen. Medications refilled. Labs pending as above.   Screening for prostate cancer - Plan: PSA  - We discussed pros and cons of prostate cancer screening, and after this discussion, he chose to have screening done. PSA obtained, and no concerning findings on DRE.   Thyroid disorder screening - Plan: TSH   Meds ordered this encounter  Medications  . Zoster Vaccine Adjuvanted [facility]) injection    Sig: Inject 0.5 mLs into the muscle once for 1 dose. Repeat in 2-6 months.    Dispense:  0.5 mL    Refill:  1  . DISCONTD: atorvastatin (LIPITOR) 10 MG tablet    Sig: Take 1 tablet (10 mg total) by mouth daily at 6 PM.    Dispense:  90 tablet    Refill:  3   Patient Instructions   No change in meds for now. Thanks for coming in today.   Preventive Care 33 Years and Older, Male Preventive care refers to lifestyle choices and visits with your health care provider that can promote health and wellness. What does preventive care include?   A yearly physical exam. This is also called an annual well check.  Dental  exams once or twice a year.  Routine eye exams. Ask your health care provider how often you should have your eyes checked.  Personal lifestyle choices, including: ? Daily care of your teeth and gums. ? Regular physical activity. ? Eating a healthy diet. ? Avoiding tobacco and drug use. ? Limiting alcohol use. ? Practicing safe sex. ? Taking low doses of aspirin every day. ? Taking vitamin and mineral supplements as recommended by your health care provider. What happens during an annual well check? The services and screenings done by your health care provider during your annual well check will depend on your age, overall health, lifestyle risk factors, and family history of disease. Counseling Your health care provider may ask you questions about your:  Alcohol use.  Tobacco use.  Drug use.  Emotional well-being.  Home and relationship well-being.  Sexual activity.  Eating habits.  History of falls.  Memory and ability to understand (cognition).  Work and work Statistician. Screening You may have the following tests or measurements:  Height, weight, and BMI.  Blood pressure.  Lipid and cholesterol levels. These may be checked every 5 years, or more frequently if you are over 97 years old.  Skin check.  Lung cancer screening. You may have this screening every year starting at age 83 if you have a 30-pack-year history of smoking and currently smoke or have quit within the past 15 years.  Colorectal cancer screening. All adults should have this screening starting at age 28 and continuing until age 81. You will have tests every 1-10 years, depending on your results and the type of screening test. People at increased risk should start screening at an earlier age. Screening tests may include: ? Guaiac-based fecal occult blood testing. ?  Fecal immunochemical test (FIT). ? Stool DNA test. ? Virtual colonoscopy. ? Sigmoidoscopy. During this test, a flexible tube with a  tiny camera (sigmoidoscope) is used to examine your rectum and lower colon. The sigmoidoscope is inserted through your anus into your rectum and lower colon. ? Colonoscopy. During this test, a long, thin, flexible tube with a tiny camera (colonoscope) is used to examine your entire colon and rectum.  Prostate cancer screening. Recommendations will vary depending on your family history and other risks.  Hepatitis C blood test.  Hepatitis B blood test.  Sexually transmitted disease (STD) testing.  Diabetes screening. This is done by checking your blood sugar (glucose) after you have not eaten for a while (fasting). You may have this done every 1-3 years.  Abdominal aortic aneurysm (AAA) screening. You may need this if you are a current or former smoker.  Osteoporosis. You may be screened starting at age 32 if you are at high risk. Talk with your health care provider about your test results, treatment options, and if necessary, the need for more tests. Vaccines Your health care provider may recommend certain vaccines, such as:  Influenza vaccine. This is recommended every year.  Tetanus, diphtheria, and acellular pertussis (Tdap, Td) vaccine. You may need a Td booster every 10 years.  Varicella vaccine. You may need this if you have not been vaccinated.  Zoster vaccine. You may need this after age 56.  Measles, mumps, and rubella (MMR) vaccine. You may need at least one dose of MMR if you were born in 1957 or later. You may also need a second dose.  Pneumococcal 13-valent conjugate (PCV13) vaccine. One dose is recommended after age 42.  Pneumococcal polysaccharide (PPSV23) vaccine. One dose is recommended after age 35.  Meningococcal vaccine. You may need this if you have certain conditions.  Hepatitis A vaccine. You may need this if you have certain conditions or if you travel or work in places where you may be exposed to hepatitis A.  Hepatitis B vaccine. You may need this if you  have certain conditions or if you travel or work in places where you may be exposed to hepatitis B.  Haemophilus influenzae type b (Hib) vaccine. You may need this if you have certain risk factors. Talk to your health care provider about which screenings and vaccines you need and how often you need them. This information is not intended to replace advice given to you by your health care provider. Make sure you discuss any questions you have with your health care provider. Document Released: 04/14/2015 Document Revised: 05/08/2017 Document Reviewed: 01/17/2015 Elsevier Interactive Patient Education  [employer].    If you have lab work done today you will be contacted with your lab results within the next 2 weeks.  If you have not heard from [place] then please contact us. The fastest way to get your results is to register for My Chart.   IF you received an x-ray today, you will receive an invoice from [facility]. Please contact [facility] at [phone] with questions or concerns regarding your invoice.   IF you received labwork today, you will receive an invoice from [facility]. Please contact [facility] at [phone] with questions or concerns regarding your invoice.   Our billing staff will not be able to assist you with questions regarding bills from these companies.  You will be contacted with the lab results as soon as they are available. The fastest way to get your results is to  activate your My Chart account. Instructions are located on the last page of this paperwork. If you have not heard from [place] regarding the results in 2 weeks, please contact this office.       Signed,   [name], MD [facility].  04/14/18 1:35 PM

## 2018-04-14 ENCOUNTER — Encounter: Payer: Self-pay | Admitting: Family Medicine

## 2018-04-14 LAB — COMPREHENSIVE METABOLIC PANEL
A/G RATIO: 2 (ref 1.2–2.2)
ALBUMIN: 4.4 g/dL (ref 3.5–4.8)
ALT: 18 IU/L (ref 0–44)
AST: 32 IU/L (ref 0–40)
Alkaline Phosphatase: 75 IU/L (ref 39–117)
BILIRUBIN TOTAL: 0.7 mg/dL (ref 0.0–1.2)
BUN / CREAT RATIO: 14 (ref 10–24)
BUN: 15 mg/dL (ref 8–27)
CALCIUM: 9.3 mg/dL (ref 8.6–10.2)
CO2: 17 mmol/L — ABNORMAL LOW (ref 20–29)
Chloride: 104 mmol/L (ref 96–106)
Creatinine, Ser: 1.06 mg/dL (ref 0.76–1.27)
GFR, EST AFRICAN AMERICAN: 80 mL/min/{1.73_m2} (ref >59)
GFR, EST NON AFRICAN AMERICAN: 69 mL/min/{1.73_m2} (ref >59)
GLUCOSE: 91 mg/dL (ref 65–99)
Globulin, Total: 2.2 g/dL (ref 1.5–4.5)
Potassium: 4.5 mmol/L (ref 3.5–5.2)
Sodium: 142 mmol/L (ref 134–144)
TOTAL PROTEIN: 6.6 g/dL (ref 6.0–8.5)

## 2018-04-14 LAB — LIPID PANEL
Chol/HDL Ratio: 2.8 ratio (ref 0.0–5.0)
Cholesterol, Total: 141 mg/dL (ref 100–199)
HDL: 51 mg/dL (ref >39)
LDL Calculated: 78 mg/dL (ref 0–99)
TRIGLYCERIDES: 61 mg/dL (ref 0–149)
VLDL Cholesterol Cal: 12 mg/dL (ref 5–40)

## 2018-04-14 LAB — PSA: PROSTATE SPECIFIC AG, SERUM: 3.7 ng/mL (ref 0.0–4.0)

## 2018-04-14 LAB — TSH: TSH: 6.82 u[IU]/mL — ABNORMAL HIGH (ref 0.450–4.500)

## 2018-04-14 NOTE — Addendum Note (Signed)
Addended by: Suszanne Finch on: 04/14/2018 09:21 AM   Modules accepted: Level of Service

## 2018-04-27 NOTE — Progress Notes (Signed)
Note reviewed, and agree with documentation and plan.  

## 2018-06-04 DIAGNOSIS — M25562 Pain in left knee: Secondary | ICD-10-CM | POA: Diagnosis not present

## 2018-06-04 DIAGNOSIS — M1712 Unilateral primary osteoarthritis, left knee: Secondary | ICD-10-CM | POA: Diagnosis not present

## 2018-06-04 DIAGNOSIS — M25552 Pain in left hip: Secondary | ICD-10-CM | POA: Diagnosis not present

## 2018-06-24 ENCOUNTER — Telehealth: Payer: Self-pay | Admitting: Family Medicine

## 2018-06-24 NOTE — Telephone Encounter (Signed)
Copied from [place] [id number]. Topic: Referral - Request for Referral >> Jun 24, 2018 10:05 AM Reyne Dumas L wrote: Has patient seen PCP for this complaint? unsure *If NO, is insurance requiring patient see PCP for this issue before PCP can refer them? Referral for which specialty: [facility] Cardiac Scoring Test on special now for $99 Preferred provider/office: [facility] (fax number: [phone]) Reason for referral: cardiac scoring test.  Pt called and left voicemail on [facility] Voicemail box stating that this is now on special for $99 with [facility] but orders must be faxed over for him to have it.  Pt can be reached at [phone]

## 2018-06-30 NOTE — Telephone Encounter (Signed)
It appears that is a coronary calcium scoring test by CT scan. Based on age and hx of hyperlipidemia, we can place that order. I am not sure how that needs to be ordered to be done through [place].  Can call in verbal order if that is allowed, but may need to be delayed as a nonessential procedure during the COVID-19 pandemic.

## 2018-06-30 NOTE — Telephone Encounter (Signed)
Can you send Orders? Spoke with wife and she states she was told he need this and since it is on special she would like to have an order place and I will print off and fax, please advise.

## 2018-07-01 ENCOUNTER — Other Ambulatory Visit: Payer: Self-pay | Admitting: Family Medicine

## 2018-07-01 DIAGNOSIS — E785 Hyperlipidemia, unspecified: Secondary | ICD-10-CM

## 2018-07-01 NOTE — Progress Notes (Signed)
cor

## 2018-07-01 NOTE — Telephone Encounter (Signed)
Order has been faxed to [facility], confirmation received.

## 2018-07-02 ENCOUNTER — Telehealth: Payer: Self-pay | Admitting: Family Medicine

## 2018-07-02 NOTE — Telephone Encounter (Signed)
Added Dx E78.5 to order.  Placed in provider box for signature.

## 2018-07-02 NOTE — Telephone Encounter (Signed)
Copied from [place] [id number]. Topic: Quick Communication - See Telephone Encounter >> Jul 02, 2018 11:22 AM Sheran Luz wrote: CRM for notification. See Telephone encounter for: 07/02/18.  Tina with [facility] radiology, calling requesting order for CT be resent as the one they received did not include diagnosis or provider signature.   [place]

## 2018-07-02 NOTE — Telephone Encounter (Signed)
Please see note below. 

## 2018-09-18 ENCOUNTER — Telehealth: Payer: Self-pay | Admitting: Family Medicine

## 2018-09-18 DIAGNOSIS — E785 Hyperlipidemia, unspecified: Secondary | ICD-10-CM

## 2018-09-18 NOTE — Telephone Encounter (Signed)
Pt dropped by to make sure we had received  CT results from [facility].fro both himself and his wife Jesse Riddle) He is wanting a phone call once you have had a chaqnce to review results    FR

## 2018-09-18 NOTE — Telephone Encounter (Signed)
I have called the pt and he stated that he has received the results. I stated understanding

## 2018-09-25 NOTE — Telephone Encounter (Signed)
Coronary calcium score of 196. - moderate calcified coronary plaque.   Currently on 10mg  Lipitor.   10 year ASCVD risk approx 13%.   Lab Results  Component Value Date   CHOL 141 04/13/2018   HDL 51 04/13/2018   LDLCALC 78 04/13/2018   TRIG 61 04/13/2018   CHOLHDL 2.8 04/13/2018   Due for updated lipids next month, then would consider higher statin dose given CAC score greater than 100. Option to meet with lipid specialist given as well.   Plan for updated lipid panel and then consideration on 20mg  lipitor. Hold on refill until lab results.

## 2018-10-06 ENCOUNTER — Other Ambulatory Visit: Payer: Self-pay

## 2018-10-06 ENCOUNTER — Ambulatory Visit (INDEPENDENT_AMBULATORY_CARE_PROVIDER_SITE_OTHER): Payer: PPO | Admitting: Family Medicine

## 2018-10-06 ENCOUNTER — Telehealth: Payer: Self-pay | Admitting: Family Medicine

## 2018-10-06 DIAGNOSIS — E785 Hyperlipidemia, unspecified: Secondary | ICD-10-CM | POA: Diagnosis not present

## 2018-10-06 NOTE — Telephone Encounter (Signed)
Pt wants recent results from past 2 visits on labs looking for PSA as well no results on my chart please call when ready for him to pick up

## 2018-10-07 LAB — LIPID PANEL
Chol/HDL Ratio: 2.8 ratio (ref 0.0–5.0)
Cholesterol, Total: 146 mg/dL (ref 100–199)
HDL: 52 mg/dL (ref >39)
LDL Calculated: 84 mg/dL (ref 0–99)
Triglycerides: 51 mg/dL (ref 0–149)
VLDL Cholesterol Cal: 10 mg/dL (ref 5–40)

## 2018-10-07 LAB — COMPREHENSIVE METABOLIC PANEL
ALT: 21 IU/L (ref 0–44)
AST: 34 IU/L (ref 0–40)
Albumin/Globulin Ratio: 2 (ref 1.2–2.2)
Albumin: 4.3 g/dL (ref 3.7–4.7)
Alkaline Phosphatase: 67 IU/L (ref 39–117)
BUN/Creatinine Ratio: 14 (ref 10–24)
BUN: 14 mg/dL (ref 8–27)
Bilirubin Total: 0.5 mg/dL (ref 0.0–1.2)
CO2: 22 mmol/L (ref 20–29)
Calcium: 9 mg/dL (ref 8.6–10.2)
Chloride: 105 mmol/L (ref 96–106)
Creatinine, Ser: 1.03 mg/dL (ref 0.76–1.27)
GFR calc Af Amer: 82 mL/min/{1.73_m2} (ref >59)
GFR calc non Af Amer: 71 mL/min/{1.73_m2} (ref >59)
Globulin, Total: 2.1 g/dL (ref 1.5–4.5)
Glucose: 96 mg/dL (ref 65–99)
Potassium: 4.1 mmol/L (ref 3.5–5.2)
Sodium: 140 mmol/L (ref 134–144)
Total Protein: 6.4 g/dL (ref 6.0–8.5)

## 2018-10-07 NOTE — Progress Notes (Signed)
Nurse visit only. Not seen by provider.

## 2018-10-09 NOTE — Telephone Encounter (Signed)
Labs printed and up at the front for pick up.

## 2018-10-21 ENCOUNTER — Telehealth: Payer: Self-pay | Admitting: Family Medicine

## 2018-10-21 NOTE — Telephone Encounter (Signed)
Pt is calling wanting to know when can he get his shingle shot.

## 2018-10-23 ENCOUNTER — Other Ambulatory Visit: Payer: Self-pay | Admitting: Emergency Medicine

## 2018-10-23 DIAGNOSIS — Z23 Encounter for immunization: Secondary | ICD-10-CM

## 2018-10-23 MED ORDER — SHINGRIX 50 MCG/0.5ML IM SUSR: 0.5000 mL | Freq: Once | INTRAMUSCULAR | 1 refills | Status: AC

## 2019-04-21 ENCOUNTER — Ambulatory Visit: Payer: PPO | Attending: Internal Medicine

## 2019-04-21 DIAGNOSIS — Z23 Encounter for immunization: Secondary | ICD-10-CM | POA: Insufficient documentation

## 2019-04-21 NOTE — Progress Notes (Signed)
   Covid-19 Vaccination Clinic  Name:  Jesse Riddle    MRN: [id number] DOB: 1945-12-23  04/21/2019  Jesse Riddle was observed post Covid-19 immunization for 15 minutes without incidence. He was provided with Vaccine Information Sheet and instruction to access the V-Safe system.   Jesse Riddle was instructed to call 911 with any severe reactions post vaccine: Marland Kitchen Difficulty breathing  . Swelling of your face and throat  . A fast heartbeat  . A bad rash all over your body  . Dizziness and weakness    Immunizations Administered    Name Date Dose VIS Date Route   Pfizer COVID-19 Vaccine 04/21/2019  4:27 PM 0.3 mL 03/12/2019 Intramuscular   Manufacturer: [place]   Lot: [id number]   [place]: [id number]

## 2019-04-23 ENCOUNTER — Other Ambulatory Visit: Payer: Self-pay | Admitting: Family Medicine

## 2019-04-23 DIAGNOSIS — E785 Hyperlipidemia, unspecified: Secondary | ICD-10-CM

## 2019-04-23 NOTE — Telephone Encounter (Signed)
Forwarding medication refill request to the clinical pool for review. 

## 2019-04-26 NOTE — Telephone Encounter (Signed)
[place] refill given. Pt needs appointment to receive additional refills.

## 2019-04-27 ENCOUNTER — Telehealth: Payer: Self-pay | Admitting: Family Medicine

## 2019-04-27 NOTE — Telephone Encounter (Signed)
Pt. Scheduled physical for 05/12/2019

## 2019-04-27 NOTE — Telephone Encounter (Signed)
error 

## 2019-04-29 ENCOUNTER — Ambulatory Visit: Payer: Self-pay

## 2019-05-10 ENCOUNTER — Ambulatory Visit: Payer: Self-pay

## 2019-05-12 ENCOUNTER — Encounter: Payer: Self-pay | Admitting: Family Medicine

## 2019-05-12 ENCOUNTER — Ambulatory Visit (INDEPENDENT_AMBULATORY_CARE_PROVIDER_SITE_OTHER): Payer: PPO | Admitting: Family Medicine

## 2019-05-12 ENCOUNTER — Other Ambulatory Visit: Payer: Self-pay

## 2019-05-12 VITALS — BP 137/85 | HR 64 | Temp 97.8°F | Ht 70.0 in | Wt 174.4 lb

## 2019-05-12 DIAGNOSIS — E785 Hyperlipidemia, unspecified: Secondary | ICD-10-CM

## 2019-05-12 DIAGNOSIS — Z125 Encounter for screening for malignant neoplasm of prostate: Secondary | ICD-10-CM

## 2019-05-12 DIAGNOSIS — Z0001 Encounter for general adult medical examination with abnormal findings: Secondary | ICD-10-CM

## 2019-05-12 DIAGNOSIS — Z0000 Encounter for general adult medical examination without abnormal findings: Secondary | ICD-10-CM

## 2019-05-12 DIAGNOSIS — R7989 Other specified abnormal findings of blood chemistry: Secondary | ICD-10-CM | POA: Diagnosis not present

## 2019-05-12 MED ORDER — ATORVASTATIN CALCIUM 10 MG PO TABS: ORAL_TABLET | ORAL | 3 refills | Status: DC

## 2019-05-12 NOTE — Patient Instructions (Signed)
I will check cholesterol levels, other labs as we discussed.  Can decide on med changes once we see the results.  Thank you for coming in today and take care.  Preventive Care 75 Years and Older, Male Preventive care refers to lifestyle choices and visits with your health care provider that can promote health and wellness. This includes:  A yearly physical exam. This is also called an annual well check.  Regular dental and eye exams.  Immunizations.  Screening for certain conditions.  Healthy lifestyle choices, such as diet and exercise. What can I expect for my preventive care visit? Physical exam Your health care provider will check:  Height and weight. These may be used to calculate body mass index (BMI), which is a measurement that tells if you are at a healthy weight.  Heart rate and blood pressure.  Your skin for abnormal spots. Counseling Your health care provider may ask you questions about:  Alcohol, tobacco, and drug use.  Emotional well-being.  Home and relationship well-being.  Sexual activity.  Eating habits.  History of falls.  Memory and ability to understand (cognition).  Work and work Statistician. What immunizations do I need?  Influenza (flu) vaccine  This is recommended every year. Tetanus, diphtheria, and pertussis (Tdap) vaccine  You may need a Td booster every 10 years. Varicella (chickenpox) vaccine  You may need this vaccine if you have not already been vaccinated. Zoster (shingles) vaccine  You may need this after age 10. Pneumococcal conjugate (PCV13) vaccine  One dose is recommended after age 22. Pneumococcal polysaccharide (PPSV23) vaccine  One dose is recommended after age 17. Measles, mumps, and rubella (MMR) vaccine  You may need at least one dose of MMR if you were born in 1957 or later. You may also need a second dose. Meningococcal conjugate (MenACWY) vaccine  You may need this if you have certain  conditions. Hepatitis A vaccine  You may need this if you have certain conditions or if you travel or work in places where you may be exposed to hepatitis A. Hepatitis B vaccine  You may need this if you have certain conditions or if you travel or work in places where you may be exposed to hepatitis B. Haemophilus influenzae type b (Hib) vaccine  You may need this if you have certain conditions. You may receive vaccines as individual doses or as more than one vaccine together in one shot (combination vaccines). Talk with your health care provider about the risks and benefits of combination vaccines. What tests do I need? Blood tests  Lipid and cholesterol levels. These may be checked every 5 years, or more frequently depending on your overall health.  Hepatitis C test.  Hepatitis B test. Screening  Lung cancer screening. You may have this screening every year starting at age 75 if you have a 30-pack-year history of smoking and currently smoke or have quit within the past 15 years.  Colorectal cancer screening. All adults should have this screening starting at age 75 and continuing until age 85. Your health care provider may recommend screening at age 35 if you are at increased risk. You will have tests every 1-10 years, depending on your results and the type of screening test.  Prostate cancer screening. Recommendations will vary depending on your family history and other risks.  Diabetes screening. This is done by checking your blood sugar (glucose) after you have not eaten for a while (fasting). You may have this done every 1-3 years.  Abdominal  aortic aneurysm (AAA) screening. You may need this if you are a current or former smoker.  Sexually transmitted disease (STD) testing. Follow these instructions at home: Eating and drinking  Eat a diet that includes fresh fruits and vegetables, whole grains, lean protein, and low-fat dairy products. Limit your intake of foods with high  amounts of sugar, saturated fats, and salt.  Take vitamin and mineral supplements as recommended by your health care provider.  Do not drink alcohol if your health care provider tells you not to drink.  If you drink alcohol: ? Limit how much you have to 0-2 drinks a day. ? Be aware of how much alcohol is in your drink. In the [place] one drink equals one 12 oz bottle of beer (355 mL), one 5 oz glass of wine (148 mL), or one 1 oz glass of hard liquor (44 mL). Lifestyle  Take daily care of your teeth and gums.  Stay active. Exercise for at least 30 minutes on 5 or more days each week.  Do not use any products that contain nicotine or tobacco, such as cigarettes, e-cigarettes, and chewing tobacco. If you need help quitting, ask your health care provider.  If you are sexually active, practice safe sex. Use a condom or other form of protection to prevent STIs (sexually transmitted infections).  Talk with your health care provider about taking a low-dose aspirin or statin. What's next?  Visit your health care provider once a year for a well check visit.  Ask your health care provider how often you should have your eyes and teeth checked.  Stay up to date on all vaccines. This information is not intended to replace advice given to you by your health care provider. Make sure you discuss any questions you have with your health care provider. Document Revised: 03/12/2018 Document Reviewed: 03/12/2018 Elsevier Patient Education  2020 [employer].

## 2019-05-12 NOTE — Progress Notes (Signed)
Subjective:  Patient ID: Jesse Riddle, male    DOB: [birth year]  Age: 74 y.o. MRN: [id number]  CC:  Chief Complaint  Patient presents with  . Annual Exam    No complaints    HPI Jesse Riddle presents for   Physical/wellness exam:  Care team: Orthopedics, Dr. Wynelle Link PCP, me  Elevated TSH: Lab Results  Component Value Date   TSH 6.820 (H) 04/13/2018   Mild elevation, no meds.   No new hot or cold intolerance. No new hair or skin changes, heart palpitations or new fatigue. No new weight changes.    Hyperlipidemia: Lipitor 10 mg daily.  Coronary calcium score last June of 196 with moderate calcified coronary plaque.  10-year ASCVD risk 13% at that time.  Option of higher dose Lipitor had been discussed previously. Still on asa 96m QD. Would like to continue after risks/benefits discussed.  Lab Results  Component Value Date   CHOL 146 10/06/2018   HDL 52 10/06/2018   LDLCALC 84 10/06/2018   TRIG 51 10/06/2018   CHOLHDL 2.8 10/06/2018   Lab Results  Component Value Date   ALT 21 10/06/2018   AST 34 10/06/2018   ALKPHOS 67 10/06/2018   BILITOT 0.5 10/06/2018   Fall Risk  05/12/2019 04/13/2018 04/13/2018 04/13/2018 04/10/2017  Falls in the past year? 0 0 0 0 No  Number falls in past yr: 0 - 0 0 -  Injury with Fall? 0 - 0 0 -  Risk for fall due to : - - - - -  Risk for fall due to: Comment - - - - -  Follow up Falls evaluation completed - - - -   [facility] 05/12/2019 04/13/2018 04/13/2018 04/10/2017 04/10/2017  Decreased Interest 0 0 0 0 0  Down, Depressed, Hopeless 0 0 0 0 0  PHQ - 2 Score 0 0 0 0 0   Cancer screening: Colonoscopy 11/16/2013.  Planned repeat in 1-2 years. Requests PSA today -  Discussed risks of false positive/overtesting/overtreatment. Deferred DRE.   Immunization History  Administered Date(s) Administered  . Influenza,inj,Quad PF,6+ Mos 12/27/2013, 11/25/2016  . Influenza-Unspecified 12/31/2014, 01/08/2016  . PFIZER SARS-COV-2  Vaccination 04/21/2019  . Pneumococcal Conjugate-13 12/27/2013  . Pneumococcal Polysaccharide-23 11/22/2011  . Tdap 11/22/2011  . Zoster 02/27/2013  Shingles: shingrix - s/p 1 in 10/20, second one pending.  Covid 19 vaccine: 1st one 3 weeks ago. 2nd one in few days.  Flu shot at pharmacy last September  Functional Status Survey: Is the patient deaf or have difficulty hearing?: No Does the patient have difficulty seeing, even when wearing glasses/contacts?: No Does the patient have difficulty concentrating, remembering, or making decisions?: No Does the patient have difficulty walking or climbing stairs?: No Does the patient have difficulty dressing or bathing?: No Does the patient have difficulty doing errands alone such as visiting a doctor's office or shopping?: No  6CIT Screen 05/12/2019 04/13/2018 04/13/2018 04/09/2017  What Year? 0 points 0 points 0 points 0 points  What month? 0 points 0 points 0 points 0 points  What time? 0 points 0 points 0 points 0 points  Count back from 20 0 points 0 points 0 points 0 points  Months in reverse 0 points 0 points 0 points 0 points  Repeat phrase 2 points 0 points 0 points 0 points  Total Score 2 0 0 0     Hearing Screening   125Hz 250Hz 500Hz 1000Hz 2000Hz 3000Hz 4000Hz 6000Hz 8000Hz  Right ear:           Left ear:             Visual Acuity Screening   Right eye Left eye Both eyes  Without correction: 20/15-1 20/50 20/15-1  With correction:     optho - appt in 2 weeks. No new visual concerns. Cataract on left.   Dental: every 6 months.   Exercise: 3-4 days per week.    Office Visit from 05/12/2019 in [facility] at [facility]  AUDIT-C Score  3     Advanced directives:       History There are no problems to display for this patient.  Past Medical History:  Diagnosis Date  . Allergy    penicillin   No past surgical history on file. Allergies  Allergen Reactions  . Penicillins Other (See Comments)    child   Prior to  Admission medications   Medication Sig Start Date End Date Taking? Authorizing Provider  aspirin 81 MG tablet Take 81 mg by mouth daily.   Yes [provider]  atorvastatin (LIPITOR) 10 MG tablet TAKE 1 TABLET BY MOUTH ONCE DAILY AT  6  PM 04/26/19  Yes Wendie Agreste, MD  fish oil-omega-3 fatty acids 1000 MG capsule Take 1 g by mouth daily.   Yes [provider]  [place] taking daily   Yes [provider]   Social History   Socioeconomic History  . Marital status: Married    Spouse name: Not on file  . Number of children: 3  . Years of education: Not on file  . Highest education level: Bachelor's degree (e.g., BA, AB, BS)  Occupational History  . Occupation: Retired    Fish farm manager: Retail banker  Tobacco Use  . Smoking status: Never Smoker  . Smokeless tobacco: Never Used  Substance and Sexual Activity  . Alcohol use: Yes    Alcohol/week: 0.0 standard drinks    Comment: Occasional  . Drug use: No  . Sexual activity: Yes  Other Topics Concern  . Not on file  Social History Narrative   Married. Education: [employer]. Exercise: walk/bike 3 times a week for 30 minutes.   Social Determinants of Health   Financial Resource Strain:   . Difficulty of Paying Living Expenses: Not on file  Food Insecurity:   . Worried About Charity fundraiser in the Last Year: Not on file  . Ran Out of Food in the Last Year: Not on file  Transportation Needs:   . Lack of Transportation (Medical): Not on file  . Lack of Transportation (Non-Medical): Not on file  Physical Activity:   . Days of Exercise per Week: Not on file  . Minutes of Exercise per Session: Not on file  Stress:   . Feeling of Stress : Not on file  Social Connections:   . Frequency of Communication with Friends and Family: Not on file  . Frequency of Social Gatherings with Friends and Family: Not on file  . Attends Religious Services: Not on file  . Active Member of  Clubs or Organizations: Not on file  . Attends Archivist Meetings: Not on file  . Marital Status: Not on file  Intimate Partner Violence:   . Fear of Current or Ex-Partner: Not on file  . Emotionally Abused: Not on file  . Physically Abused: Not on file  . Sexually Abused: Not on file    Review of Systems  13 point review of systems per patient health survey noted.  Negative other than as indicated above or in HPI.   Objective:   Vitals:   05/12/19 0957  BP: 137/85  Pulse: 64  Temp: 97.8 F (36.6 C)  TempSrc: Temporal  SpO2: 97%  Weight: 174 lb 6.4 oz (79.1 kg)  Height: 5' 10" (1.778 m)   Physical Exam Vitals reviewed.  Constitutional:      Appearance: He is well-developed.  HENT:     Head: Normocephalic and atraumatic.     Right Ear: External ear normal.     Left Ear: External ear normal.  Eyes:     Conjunctiva/sclera: Conjunctivae normal.     Pupils: Pupils are equal, round, and reactive to light.  Neck:     Thyroid: No thyromegaly.  Cardiovascular:     Rate and Rhythm: Normal rate and regular rhythm.     Heart sounds: Normal heart sounds.  Pulmonary:     Effort: Pulmonary effort is normal. No respiratory distress.     Breath sounds: Normal breath sounds. No wheezing.  Abdominal:     General: There is no distension.     Palpations: Abdomen is soft.     Tenderness: There is no abdominal tenderness.  Musculoskeletal:        General: No tenderness. Normal range of motion.     Cervical back: Normal range of motion and neck supple.  Lymphadenopathy:     Cervical: No cervical adenopathy.  Skin:    General: Skin is warm and dry.  Neurological:     Mental Status: He is alert and oriented to person, place, and time.     Deep Tendon Reflexes: Reflexes are normal and symmetric.  Psychiatric:        Behavior: Behavior normal.       Assessment & Plan:  Jesse Riddle is a 74 y.o. male . Medicare annual wellness visit, subsequent  - - anticipatory  guidance as below in AVS, screening labs if needed. Health maintenance items as above in HPI discussed/recommended as applicable.  - no concerning responses on depression, fall, or functional status screening. Any positive responses noted as above. Advanced directives discussed as in CHL.   Hyperlipidemia, unspecified hyperlipidemia type - Plan: CMP14+EGFR, Lipid Panel  -Depending on LDL, option of higher dose Lipitor.  Continue same for now, labs pending.  Screening for prostate cancer - Plan: PSA  -Wrist versus benefits of testing discussed before.  Including risk of false positives, over testing/overtreatment, along with age 48.   He would like to repeat PSA 1 more time, DRE declined.  Elevated TSH - Plan: TSH  -Asymptomatic, will repeat testing.  No orders of the defined types were placed in this encounter.  Patient Instructions  I will check cholesterol levels, other labs as we discussed.  Can decide on med changes once we see the results.  Thank you for coming in today and take care.  Preventive Care 96 Years and Older, Male Preventive care refers to lifestyle choices and visits with your health care provider that can promote health and wellness. This includes:  A yearly physical exam. This is also called an annual well check.  Regular dental and eye exams.  Immunizations.  Screening for certain conditions.  Healthy lifestyle choices, such as diet and exercise. What can I expect for my preventive care visit? Physical exam Your health care provider will check:  Height and weight. These may be used to calculate body mass index (BMI),  which is a measurement that tells if you are at a healthy weight.  Heart rate and blood pressure.  Your skin for abnormal spots. Counseling Your health care provider may ask you questions about:  Alcohol, tobacco, and drug use.  Emotional well-being.  Home and relationship well-being.  Sexual activity.  Eating habits.  History of  falls.  Memory and ability to understand (cognition).  Work and work Statistician. What immunizations do I need?  Influenza (flu) vaccine  This is recommended every year. Tetanus, diphtheria, and pertussis (Tdap) vaccine  You may need a Td booster every 10 years. Varicella (chickenpox) vaccine  You may need this vaccine if you have not already been vaccinated. Zoster (shingles) vaccine  You may need this after age 42. Pneumococcal conjugate (PCV13) vaccine  One dose is recommended after age 7. Pneumococcal polysaccharide (PPSV23) vaccine  One dose is recommended after age 43. Measles, mumps, and rubella (MMR) vaccine  You may need at least one dose of MMR if you were born in 1957 or later. You may also need a second dose. Meningococcal conjugate (MenACWY) vaccine  You may need this if you have certain conditions. Hepatitis A vaccine  You may need this if you have certain conditions or if you travel or work in places where you may be exposed to hepatitis A. Hepatitis B vaccine  You may need this if you have certain conditions or if you travel or work in places where you may be exposed to hepatitis B. Haemophilus influenzae type b (Hib) vaccine  You may need this if you have certain conditions. You may receive vaccines as individual doses or as more than one vaccine together in one shot (combination vaccines). Talk with your health care provider about the risks and benefits of combination vaccines. What tests do I need? Blood tests  Lipid and cholesterol levels. These may be checked every 5 years, or more frequently depending on your overall health.  Hepatitis C test.  Hepatitis B test. Screening  Lung cancer screening. You may have this screening every year starting at age 67 if you have a 30-pack-year history of smoking and currently smoke or have quit within the past 15 years.  Colorectal cancer screening. All adults should have this screening starting at age 78  and continuing until age 93. Your health care provider may recommend screening at age 49 if you are at increased risk. You will have tests every 1-10 years, depending on your results and the type of screening test.  Prostate cancer screening. Recommendations will vary depending on your family history and other risks.  Diabetes screening. This is done by checking your blood sugar (glucose) after you have not eaten for a while (fasting). You may have this done every 1-3 years.  Abdominal aortic aneurysm (AAA) screening. You may need this if you are a current or former smoker.  Sexually transmitted disease (STD) testing. Follow these instructions at home: Eating and drinking  Eat a diet that includes fresh fruits and vegetables, whole grains, lean protein, and low-fat dairy products. Limit your intake of foods with high amounts of sugar, saturated fats, and salt.  Take vitamin and mineral supplements as recommended by your health care provider.  Do not drink alcohol if your health care provider tells you not to drink.  If you drink alcohol: ? Limit how much you have to 0-2 drinks a day. ? Be aware of how much alcohol is in your drink. In the [place] one drink equals one 12  oz bottle of beer (355 mL), one 5 oz glass of wine (148 mL), or one 1 oz glass of hard liquor (44 mL). Lifestyle  Take daily care of your teeth and gums.  Stay active. Exercise for at least 30 minutes on 5 or more days each week.  Do not use any products that contain nicotine or tobacco, such as cigarettes, e-cigarettes, and chewing tobacco. If you need help quitting, ask your health care provider.  If you are sexually active, practice safe sex. Use a condom or other form of protection to prevent STIs (sexually transmitted infections).  Talk with your health care provider about taking a low-dose aspirin or statin. What's next?  Visit your health care provider once a year for a well check visit.  Ask your health care  provider how often you should have your eyes and teeth checked.  Stay up to date on all vaccines. This information is not intended to replace advice given to you by your health care provider. Make sure you discuss any questions you have with your health care provider. Document Revised: 03/12/2018 Document Reviewed: 03/12/2018 Elsevier Patient Education  2020 [employer].      Signed, [name], MD [facility]

## 2019-05-13 LAB — LIPID PANEL
Chol/HDL Ratio: 2.6 ratio (ref 0.0–5.0)
Cholesterol, Total: 128 mg/dL (ref 100–199)
HDL: 50 mg/dL (ref >39)
LDL Chol Calc (NIH): 66 mg/dL (ref 0–99)
Triglycerides: 55 mg/dL (ref 0–149)
VLDL Cholesterol Cal: 12 mg/dL (ref 5–40)

## 2019-05-13 LAB — CMP14+EGFR
ALT: 16 IU/L (ref 0–44)
AST: 30 IU/L (ref 0–40)
Albumin/Globulin Ratio: 2.2 (ref 1.2–2.2)
Albumin: 4.4 g/dL (ref 3.7–4.7)
Alkaline Phosphatase: 70 IU/L (ref 39–117)
BUN/Creatinine Ratio: 12 (ref 10–24)
BUN: 11 mg/dL (ref 8–27)
Bilirubin Total: 0.5 mg/dL (ref 0.0–1.2)
CO2: 22 mmol/L (ref 20–29)
Calcium: 8.9 mg/dL (ref 8.6–10.2)
Chloride: 106 mmol/L (ref 96–106)
Creatinine, Ser: 0.95 mg/dL (ref 0.76–1.27)
GFR calc Af Amer: 90 mL/min/{1.73_m2} (ref >59)
GFR calc non Af Amer: 78 mL/min/{1.73_m2} (ref >59)
Globulin, Total: 2 g/dL (ref 1.5–4.5)
Glucose: 89 mg/dL (ref 65–99)
Potassium: 4.7 mmol/L (ref 3.5–5.2)
Sodium: 141 mmol/L (ref 134–144)
Total Protein: 6.4 g/dL (ref 6.0–8.5)

## 2019-05-13 LAB — PSA: Prostate Specific Ag, Serum: 2.6 ng/mL (ref 0.0–4.0)

## 2019-05-13 LAB — TSH: TSH: 5.08 u[IU]/mL — ABNORMAL HIGH (ref 0.450–4.500)

## 2019-05-14 ENCOUNTER — Ambulatory Visit: Payer: Self-pay

## 2019-05-14 ENCOUNTER — Ambulatory Visit: Payer: PPO | Attending: Internal Medicine

## 2019-05-14 DIAGNOSIS — Z23 Encounter for immunization: Secondary | ICD-10-CM | POA: Insufficient documentation

## 2019-05-14 NOTE — Telephone Encounter (Signed)
Pt. Had second COVID 19 vaccine today. Asking when he could take his second shingles vaccine.Instructed 2 weeks, and he indicated his pharmacy told him this as well.

## 2019-05-14 NOTE — Progress Notes (Signed)
   Covid-19 Vaccination Clinic  Name:  Jesse Riddle    MRN: [id number] DOB: [birth year]  05/14/2019  Jesse Riddle was observed post Covid-19 immunization for 15 minutes without incidence. He was provided with Vaccine Information Sheet and instruction to access the V-Safe system.   Jesse Riddle was instructed to call 911 with any severe reactions post vaccine: Marland Kitchen Difficulty breathing  . Swelling of your face and throat  . A fast heartbeat  . A bad rash all over your body  . Dizziness and weakness    Immunizations Administered    Name Date Dose VIS Date Route   Pfizer COVID-19 Vaccine 05/14/2019  2:30 PM 0.3 mL 03/12/2019 Intramuscular   Manufacturer: [place]   Lot: [id number]   [place]: [id number]

## 2019-05-29 ENCOUNTER — Ambulatory Visit: Payer: Self-pay

## 2019-06-04 DIAGNOSIS — H26491 Other secondary cataract, right eye: Secondary | ICD-10-CM | POA: Diagnosis not present

## 2019-06-04 DIAGNOSIS — H2512 Age-related nuclear cataract, left eye: Secondary | ICD-10-CM | POA: Diagnosis not present

## 2019-06-04 DIAGNOSIS — H524 Presbyopia: Secondary | ICD-10-CM | POA: Diagnosis not present

## 2019-06-04 DIAGNOSIS — H43813 Vitreous degeneration, bilateral: Secondary | ICD-10-CM | POA: Diagnosis not present

## 2019-10-28 ENCOUNTER — Encounter: Payer: Self-pay | Admitting: Family Medicine

## 2020-05-30 ENCOUNTER — Other Ambulatory Visit: Payer: Self-pay | Admitting: Family Medicine

## 2020-05-30 DIAGNOSIS — E785 Hyperlipidemia, unspecified: Secondary | ICD-10-CM

## 2020-05-30 NOTE — Telephone Encounter (Signed)
Courtesy refill. Patient needs appointment. Requested Prescriptions  Pending Prescriptions Disp Refills  . atorvastatin (LIPITOR) 10 MG tablet [Pharmacy Med Name: ATORVASTATIN 10 MG TABLET] 30 tablet 0    Sig: Take 1 tablet by mouth once daily at 6pm     Cardiovascular:  Antilipid - Statins Failed - 05/30/2020  5:25 AM      Failed - Total Cholesterol in normal range and within 360 days    Cholesterol, Total  Date Value Ref Range Status  05/12/2019 128 100 - 199 mg/dL Final         Failed - LDL in normal range and within 360 days    LDL Chol Calc (NIH)  Date Value Ref Range Status  05/12/2019 66 0 - 99 mg/dL Final         Failed - HDL in normal range and within 360 days    HDL  Date Value Ref Range Status  05/12/2019 50 >39 mg/dL Final         Failed - Triglycerides in normal range and within 360 days    Triglycerides  Date Value Ref Range Status  05/12/2019 55 0 - 149 mg/dL Final         Failed - Valid encounter within last 12 months    Recent Outpatient Visits          1 year ago Medicare annual wellness visit, subsequent   [facility], Ranell Patrick, MD   1 year ago Hyperlipidemia, unspecified hyperlipidemia type   [facility], Ranell Patrick, MD   2 years ago Encounter for Medicare annual wellness exam   [facility], Ranell Patrick, MD   2 years ago Annual physical exam   [facility], Ranell Patrick, MD   3 years ago Annual physical exam   [facility], Ranell Patrick, MD             Passed - Patient is not pregnant

## 2020-06-07 DIAGNOSIS — H43813 Vitreous degeneration, bilateral: Secondary | ICD-10-CM | POA: Diagnosis not present

## 2020-06-07 DIAGNOSIS — H2511 Age-related nuclear cataract, right eye: Secondary | ICD-10-CM | POA: Diagnosis not present

## 2020-06-07 DIAGNOSIS — H26491 Other secondary cataract, right eye: Secondary | ICD-10-CM | POA: Diagnosis not present

## 2020-06-07 DIAGNOSIS — H5202 Hypermetropia, left eye: Secondary | ICD-10-CM | POA: Diagnosis not present

## 2020-08-01 ENCOUNTER — Telehealth: Payer: Self-pay

## 2020-08-01 DIAGNOSIS — E785 Hyperlipidemia, unspecified: Secondary | ICD-10-CM

## 2020-08-01 NOTE — Telephone Encounter (Signed)
  LAST APPOINTMENT DATE:05/12/2019   NEXT APPOINTMENT DATE:@6 /30/2022  MEDICATION:atorvastatin (LIPITOR) 10 MG tablet  [place] ([place], [place]   Please advise

## 2020-08-02 MED ORDER — ATORVASTATIN CALCIUM 10 MG PO TABS: ORAL_TABLET | ORAL | 0 refills | Status: DC

## 2020-08-02 NOTE — Addendum Note (Signed)
Addended by: [name] R on: 08/02/2020 05:49 PM   Modules accepted: Orders

## 2020-08-18 DIAGNOSIS — L821 Other seborrheic keratosis: Secondary | ICD-10-CM | POA: Diagnosis not present

## 2020-08-18 DIAGNOSIS — L820 Inflamed seborrheic keratosis: Secondary | ICD-10-CM | POA: Diagnosis not present

## 2020-08-18 DIAGNOSIS — D225 Melanocytic nevi of trunk: Secondary | ICD-10-CM | POA: Diagnosis not present

## 2020-09-21 ENCOUNTER — Encounter: Payer: PPO | Admitting: Family Medicine

## 2020-09-25 ENCOUNTER — Encounter: Payer: PPO | Admitting: Family Medicine

## 2020-09-25 DIAGNOSIS — H04121 Dry eye syndrome of right lacrimal gland: Secondary | ICD-10-CM | POA: Diagnosis not present

## 2020-09-25 DIAGNOSIS — H26491 Other secondary cataract, right eye: Secondary | ICD-10-CM | POA: Diagnosis not present

## 2020-09-28 ENCOUNTER — Encounter: Payer: Self-pay | Admitting: Family Medicine

## 2020-09-28 ENCOUNTER — Other Ambulatory Visit: Payer: Self-pay

## 2020-09-28 ENCOUNTER — Ambulatory Visit (INDEPENDENT_AMBULATORY_CARE_PROVIDER_SITE_OTHER): Payer: PPO | Admitting: Family Medicine

## 2020-09-28 VITALS — BP 136/70 | HR 73 | Temp 98.0°F | Wt 170.4 lb

## 2020-09-28 DIAGNOSIS — Z0000 Encounter for general adult medical examination without abnormal findings: Secondary | ICD-10-CM

## 2020-09-28 DIAGNOSIS — E785 Hyperlipidemia, unspecified: Secondary | ICD-10-CM

## 2020-09-28 DIAGNOSIS — R252 Cramp and spasm: Secondary | ICD-10-CM | POA: Diagnosis not present

## 2020-09-28 MED ORDER — ATORVASTATIN CALCIUM 10 MG PO TABS: ORAL_TABLET | ORAL | 3 refills | Status: DC

## 2020-09-28 NOTE — Progress Notes (Signed)
Subjective:  Patient ID: Jesse Riddle, male    DOB: 1945/08/04  Age: 75 y.o. MRN: [id number]  CC:  Chief Complaint  Patient presents with   Annual Exam   leg cramp    Left leg, random episodes over the past months    HPI Jesse Riddle presents for   Presents for annual wellness exam.   Care team: PCP: me Ophthalmology: Dr. Satira Sark, cataract Ortho, Dr. Wynelle Link  Leg cramp: Left leg, random episodes past few months. Inside of left thigh, about once per month, quickly resolves.  No treatments.  Does take coQ10, turmeric.  Some increased outside.    Hyperlipidemia: Coronary calcium score in June 2020 of 196 with moderate calcified coronary plaque.  Option of higher dose Lipitor discussed previously.  Continued on Lipitor 10 mg daily, as well as aspirin 81 mg daily. Not fasting today.   Lab Results  Component Value Date   CHOL 128 05/12/2019   HDL 50 05/12/2019   LDLCALC 66 05/12/2019   TRIG 55 05/12/2019   CHOLHDL 2.6 05/12/2019   Lab Results  Component Value Date   ALT 16 05/12/2019   AST 30 05/12/2019   ALKPHOS 70 05/12/2019   BILITOT 0.5 05/12/2019     Fall screening Fall Risk  09/28/2020 05/12/2019 04/13/2018 04/13/2018 04/13/2018  Falls in the past year? 0 0 0 0 0  Number falls in past yr: 0 0 - 0 0  Injury with Fall? 0 0 - 0 0  Risk for fall due to : - - - - -  Risk for fall due to: Comment - - - - -  Follow up - Falls evaluation completed - - -   Lighting in home: adequate.  Loose rugs/carpets/pets:none Stairs:none.  Grab bars in bathroom:none.  Timed up and go - 6 seconds - no instability, nl gait. Pre   Depression Screening: Depression screen [facility] 2/9 09/28/2020 05/12/2019 04/13/2018 04/13/2018 04/10/2017  Decreased Interest 0 0 0 0 0  Down, Depressed, Hopeless 0 0 0 0 0  PHQ - 2 Score 0 0 0 0 0    Cancer Screening: Colonoscopy: due this year.  Lab Results  Component Value Date   PSA1 2.6 05/12/2019   PSA1 3.7 04/13/2018   PSA1 2.9 05/03/2016    PSA 3.16 03/13/2015   PSA 2.49 12/27/2013   PSA 2.51 12/21/2012  Deferred PSA screening.    Immunization History  Administered Date(s) Administered   Influenza,inj,Quad PF,6+ Mos 12/27/2013, 11/25/2016   Influenza-Unspecified 12/31/2014, 01/08/2016   PFIZER(Purple Top)SARS-COV-2 Vaccination 04/21/2019, 05/14/2019   Pneumococcal Conjugate-13 12/27/2013   Pneumococcal Polysaccharide-23 11/22/2011   Tdap 11/22/2011   Zoster, Live 02/27/2013  Shingrix vaccine in October 2020, second 1 last year at pharmacy.  COVID-vaccine booster: plans on 2nd one. Had 1st.    Functional Status Survey: Is the patient deaf or have difficulty hearing?: No Does the patient have difficulty seeing, even when wearing glasses/contacts?: Yes Does the patient have difficulty concentrating, remembering, or making decisions?: No Does the patient have difficulty walking or climbing stairs?: No Does the patient have difficulty dressing or bathing?: No Does the patient have difficulty doing errands alone such as visiting a doctor's office or shopping?: No Followed by ophthalmology for cataracts. Visit past week.   Memory Screen: 6CIT Screen 09/28/2020 05/12/2019 04/13/2018 04/13/2018 04/09/2017  What Year? 0 points 0 points 0 points 0 points 0 points  What month? 0 points 0 points 0 points 0 points 0 points  What time?  0 points 0 points 0 points 0 points 0 points  Count back from 20 0 points 0 points 0 points 0 points 0 points  Months in reverse 0 points 0 points 0 points 0 points 0 points  Repeat phrase 0 points 2 points 0 points 0 points 0 points  Total Score 0 2 0 0 0    Alcohol Screening: [facility] Visit from 09/28/2020 in [place]  AUDIT-C Score 2      Tobacco:none.   Vision Screening   Right eye Left eye Both eyes  Without correction 20/20 20/25 20/20   With correction      Optho/optometry: As above- regular care.   Dental: every 6 months.    Exercise: 3 days per week or more - 10min. Other yardwork as well.   Advanced Directives: has living will, hcpoa - no changes.    History There are no problems to display for this patient.  Past Medical History:  Diagnosis Date   Allergy    penicillin   History reviewed. No pertinent surgical history. Allergies  Allergen Reactions   Penicillins Other (See Comments)    child   Prior to Admission medications   Medication Sig Start Date End Date Taking? Authorizing Provider  aspirin 81 MG tablet Take 81 mg by mouth daily.   Yes [provider]  atorvastatin (LIPITOR) 10 MG tablet Take 1 tablet by mouth once daily at 6pm 08/02/20  Yes Wendie Agreste, MD  fish oil-omega-3 fatty acids 1000 MG capsule Take 1 g by mouth daily.   Yes [provider]  Turmeric (QC TUMERIC COMPLEX PO) Take by mouth.   Yes [provider]  [place] taking daily Patient not taking: Reported on 09/28/2020    [provider]   Social History   Socioeconomic History   Marital status: Married    Spouse name: Not on file   Number of children: 3   Years of education: Not on file   Highest education level: Bachelor's degree (e.g., BA, AB, BS)  Occupational History   Occupation: Retired    Fish farm manager: Retail banker  Tobacco Use   Smoking status: Never   Smokeless tobacco: Never  Vaping Use   Vaping Use: Never used  Substance and Sexual Activity   Alcohol use: Yes    Alcohol/week: 0.0 standard drinks    Comment: Occasional   Drug use: No   Sexual activity: Yes  Other Topics Concern   Not on file  Social History Narrative   Married. Education: [employer]. Exercise: walk/bike 3 times a week for 30 minutes.   Social Determinants of Health   Financial Resource Strain: Not on file  Food Insecurity: Not on file  Transportation Needs: Not on file  Physical Activity: Not on file  Stress: Not on file  Social Connections: Not on file   Intimate Partner Violence: Not on file    Review of Systems   Objective:   Vitals:   09/28/20 1435  BP: 136/70  Pulse: 73  Temp: 98 F (36.7 C)  TempSrc: Temporal  SpO2: 95%  Weight: 170 lb 6.4 oz (77.3 kg)     Physical Exam Vitals reviewed.  Constitutional:      Appearance: He is well-developed.  HENT:     Head: Normocephalic and atraumatic.     Right Ear: External ear normal.     Left Ear: External ear normal.  Eyes:     Conjunctiva/sclera: Conjunctivae  normal.     Pupils: Pupils are equal, round, and reactive to light.  Neck:     Thyroid: No thyromegaly.  Cardiovascular:     Rate and Rhythm: Normal rate and regular rhythm.     Heart sounds: Normal heart sounds.  Pulmonary:     Effort: Pulmonary effort is normal. No respiratory distress.     Breath sounds: Normal breath sounds. No wheezing.  Abdominal:     General: There is no distension.     Palpations: Abdomen is soft.     Tenderness: There is no abdominal tenderness.  Musculoskeletal:        General: No tenderness. Normal range of motion.     Cervical back: Normal range of motion and neck supple.  Lymphadenopathy:     Cervical: No cervical adenopathy.  Skin:    General: Skin is warm and dry.  Neurological:     Mental Status: He is alert and oriented to person, place, and time.     Deep Tendon Reflexes: Reflexes are normal and symmetric.  Psychiatric:        Behavior: Behavior normal.       Assessment & Plan:  Jesse Riddle is a 75 y.o. male . Medicare annual wellness visit, subsequent  - - anticipatory guidance as below in AVS, screening labs if needed. Health maintenance items as above in HPI discussed/recommended as applicable.  - no concerning responses on depression, fall, or functional status screening. Any positive responses noted as above. Advanced directives discussed as in [facility].   Leg cramp - Plan: Comprehensive metabolic panel  - infrequent symptoms. Check lytes, stressed importance  of hydration, rtc if more persistent or new symptoms.   Hyperlipidemia, unspecified hyperlipidemia type - Plan: Lipid panel, Comprehensive metabolic panel  -  Stable, tolerating current regimen. Medications refilled. Labs pending as above.    No orders of the defined types were placed in this encounter.  Patient Instructions  Make sure to stay hydrated. I will check some electrolytes with your labs but if cramps are more frequent, please follow up to discuss further.   Thanks for coming in today.   Fasting lab visit in next week:       Preventive Care 65 Years and Older, Male Preventive care refers to lifestyle choices and visits with your health care provider that can promote health and wellness. This includes: A yearly physical exam. This is also called an annual wellness visit. Regular dental and eye exams. Immunizations. Screening for certain conditions. Healthy lifestyle choices, such as: Eating a healthy diet. Getting regular exercise. Not using drugs or products that contain nicotine and tobacco. Limiting alcohol use. What can I expect for my preventive care visit? Physical exam Your health care provider will check your: Height and weight. These may be used to calculate your BMI (body mass index). BMI is a measurement that tells if you are at a healthy weight. Heart rate and blood pressure. Body temperature. Skin for abnormal spots. Counseling Your health care provider may ask you questions about your: Past medical problems. Family's medical history. Alcohol, tobacco, and drug use. Emotional well-being. Home life and relationship well-being. Sexual activity. Diet, exercise, and sleep habits. History of falls. Memory and ability to understand (cognition). Work and work Statistician. Access to firearms. What immunizations do I need?  Vaccines are usually given at various ages, according to a schedule. Your health care provider will recommend vaccines for you  based on your age, medicalhistory, and lifestyle or other factors,  such as travel or where you work. What tests do I need? Blood tests Lipid and cholesterol levels. These may be checked every 5 years, or more often depending on your overall health. Hepatitis C test. Hepatitis B test. Screening Lung cancer screening. You may have this screening every year starting at age 32 if you have a 30-pack-year history of smoking and currently smoke or have quit within the past 15 years. Colorectal cancer screening. All adults should have this screening starting at age 64 and continuing until age 50. Your health care provider may recommend screening at age 56 if you are at increased risk. You will have tests every 1-10 years, depending on your results and the type of screening test. Prostate cancer screening. Recommendations will vary depending on your family history and other risks. Genital exam to check for testicular cancer or hernias. Diabetes screening. This is done by checking your blood sugar (glucose) after you have not eaten for a while (fasting). You may have this done every 1-3 years. Abdominal aortic aneurysm (AAA) screening. You may need this if you are a current or former smoker. STD (sexually transmitted disease) testing, if you are at risk. Follow these instructions at home: Eating and drinking  Eat a diet that includes fresh fruits and vegetables, whole grains, lean protein, and low-fat dairy products. Limit your intake of foods with high amounts of sugar, saturated fats, and salt. Take vitamin and mineral supplements as recommended by your health care provider. Do not drink alcohol if your health care provider tells you not to drink. If you drink alcohol: Limit how much you have to 0-2 drinks a day. Be aware of how much alcohol is in your drink. In the [place] one drink equals one 12 oz bottle of beer (355 mL), one 5 oz glass of wine (148 mL), or one 1 oz glass of hard liquor (44  mL).  Lifestyle Take daily care of your teeth and gums. Brush your teeth every morning and night with fluoride toothpaste. Floss one time each day. Stay active. Exercise for at least 30 minutes 5 or more days each week. Do not use any products that contain nicotine or tobacco, such as cigarettes, e-cigarettes, and chewing tobacco. If you need help quitting, ask your health care provider. Do not use drugs. If you are sexually active, practice safe sex. Use a condom or other form of protection to prevent STIs (sexually transmitted infections). Talk with your health care provider about taking a low-dose aspirin or statin. Find healthy ways to cope with stress, such as: Meditation, yoga, or listening to music. Journaling. Talking to a trusted person. Spending time with friends and family. Safety Always wear your seat belt while driving or riding in a vehicle. Do not drive: If you have been drinking alcohol. Do not ride with someone who has been drinking. When you are tired or distracted. While texting. Wear a helmet and other protective equipment during sports activities. If you have firearms in your house, make sure you follow all gun safety procedures. What's next? Visit your health care provider once a year for an annual wellness visit. Ask your health care provider how often you should have your eyes and teeth checked. Stay up to date on all vaccines. This information is not intended to replace advice given to you by your health care provider. Make sure you discuss any questions you have with your healthcare provider. Document Revised: 12/15/2018 Document Reviewed: 03/12/2018 Elsevier Patient Education  2022 [employer].  Signed,   [name], MD [place] 09/28/20 10:12 PM

## 2020-09-28 NOTE — Patient Instructions (Addendum)
Make sure to stay hydrated. I will check some electrolytes with your labs but if cramps are more frequent, please follow up to discuss further.   Thanks for coming in today.   Fasting lab visit in next week:       Preventive Care 76 Years and Older, Male Preventive care refers to lifestyle choices and visits with your health care provider that can promote health and wellness. This includes: A yearly physical exam. This is also called an annual wellness visit. Regular dental and eye exams. Immunizations. Screening for certain conditions. Healthy lifestyle choices, such as: Eating a healthy diet. Getting regular exercise. Not using drugs or products that contain nicotine and tobacco. Limiting alcohol use. What can I expect for my preventive care visit? Physical exam Your health care provider will check your: Height and weight. These may be used to calculate your BMI (body mass index). BMI is a measurement that tells if you are at a healthy weight. Heart rate and blood pressure. Body temperature. Skin for abnormal spots. Counseling Your health care provider may ask you questions about your: Past medical problems. Family's medical history. Alcohol, tobacco, and drug use. Emotional well-being. Home life and relationship well-being. Sexual activity. Diet, exercise, and sleep habits. History of falls. Memory and ability to understand (cognition). Work and work Statistician. Access to firearms. What immunizations do I need?  Vaccines are usually given at various ages, according to a schedule. Your health care provider will recommend vaccines for you based on your age, medicalhistory, and lifestyle or other factors, such as travel or where you work. What tests do I need? Blood tests Lipid and cholesterol levels. These may be checked every 5 years, or more often depending on your overall health. Hepatitis C test. Hepatitis B test. Screening Lung cancer screening. You may have  this screening every year starting at age 50 if you have a 30-pack-year history of smoking and currently smoke or have quit within the past 15 years. Colorectal cancer screening. All adults should have this screening starting at age 72 and continuing until age 12. Your health care provider may recommend screening at age 21 if you are at increased risk. You will have tests every 1-10 years, depending on your results and the type of screening test. Prostate cancer screening. Recommendations will vary depending on your family history and other risks. Genital exam to check for testicular cancer or hernias. Diabetes screening. This is done by checking your blood sugar (glucose) after you have not eaten for a while (fasting). You may have this done every 1-3 years. Abdominal aortic aneurysm (AAA) screening. You may need this if you are a current or former smoker. STD (sexually transmitted disease) testing, if you are at risk. Follow these instructions at home: Eating and drinking  Eat a diet that includes fresh fruits and vegetables, whole grains, lean protein, and low-fat dairy products. Limit your intake of foods with high amounts of sugar, saturated fats, and salt. Take vitamin and mineral supplements as recommended by your health care provider. Do not drink alcohol if your health care provider tells you not to drink. If you drink alcohol: Limit how much you have to 0-2 drinks a day. Be aware of how much alcohol is in your drink. In the [place] one drink equals one 12 oz bottle of beer (355 mL), one 5 oz glass of wine (148 mL), or one 1 oz glass of hard liquor (44 mL).  Lifestyle Take daily care of your teeth and gums.  Brush your teeth every morning and night with fluoride toothpaste. Floss one time each day. Stay active. Exercise for at least 30 minutes 5 or more days each week. Do not use any products that contain nicotine or tobacco, such as cigarettes, e-cigarettes, and chewing tobacco. If  you need help quitting, ask your health care provider. Do not use drugs. If you are sexually active, practice safe sex. Use a condom or other form of protection to prevent STIs (sexually transmitted infections). Talk with your health care provider about taking a low-dose aspirin or statin. Find healthy ways to cope with stress, such as: Meditation, yoga, or listening to music. Journaling. Talking to a trusted person. Spending time with friends and family. Safety Always wear your seat belt while driving or riding in a vehicle. Do not drive: If you have been drinking alcohol. Do not ride with someone who has been drinking. When you are tired or distracted. While texting. Wear a helmet and other protective equipment during sports activities. If you have firearms in your house, make sure you follow all gun safety procedures. What's next? Visit your health care provider once a year for an annual wellness visit. Ask your health care provider how often you should have your eyes and teeth checked. Stay up to date on all vaccines. This information is not intended to replace advice given to you by your health care provider. Make sure you discuss any questions you have with your healthcare provider. Document Revised: 12/15/2018 Document Reviewed: 03/12/2018 Elsevier Patient Education  2022 [employer].

## 2020-09-29 ENCOUNTER — Other Ambulatory Visit (INDEPENDENT_AMBULATORY_CARE_PROVIDER_SITE_OTHER): Payer: PPO

## 2020-09-29 DIAGNOSIS — R252 Cramp and spasm: Secondary | ICD-10-CM | POA: Diagnosis not present

## 2020-09-29 DIAGNOSIS — E785 Hyperlipidemia, unspecified: Secondary | ICD-10-CM | POA: Diagnosis not present

## 2020-09-29 LAB — COMPREHENSIVE METABOLIC PANEL
ALT: 21 U/L (ref 0–53)
AST: 37 U/L (ref 0–37)
Albumin: 4.3 g/dL (ref 3.5–5.2)
Alkaline Phosphatase: 57 U/L (ref 39–117)
BUN: 15 mg/dL (ref 6–23)
CO2: 27 mEq/L (ref 19–32)
Calcium: 8.8 mg/dL (ref 8.4–10.5)
Chloride: 109 mEq/L (ref 96–112)
Creatinine, Ser: 1.08 mg/dL (ref 0.40–1.50)
GFR: 66.69 mL/min (ref >60.00)
Glucose, Bld: 90 mg/dL (ref 70–99)
Potassium: 4.1 mEq/L (ref 3.5–5.1)
Sodium: 142 mEq/L (ref 135–145)
Total Bilirubin: 0.7 mg/dL (ref 0.2–1.2)
Total Protein: 6.9 g/dL (ref 6.0–8.3)

## 2020-09-29 LAB — LIPID PANEL
Cholesterol: 141 mg/dL (ref 0–200)
HDL: 49.9 mg/dL (ref >39.00)
LDL Cholesterol: 77 mg/dL (ref 0–99)
NonHDL: 90.85
Total CHOL/HDL Ratio: 3
Triglycerides: 68 mg/dL (ref 0.0–149.0)
VLDL: 13.6 mg/dL (ref 0.0–40.0)

## 2020-09-29 NOTE — Addendum Note (Signed)
Addended by: Octavio Manns E on: 09/29/2020 09:52 AM   Modules accepted: Orders

## 2020-11-08 DIAGNOSIS — M25552 Pain in left hip: Secondary | ICD-10-CM | POA: Diagnosis not present

## 2020-11-08 DIAGNOSIS — M25562 Pain in left knee: Secondary | ICD-10-CM | POA: Diagnosis not present

## 2020-11-08 DIAGNOSIS — M1612 Unilateral primary osteoarthritis, left hip: Secondary | ICD-10-CM | POA: Diagnosis not present

## 2020-11-22 DIAGNOSIS — M25559 Pain in unspecified hip: Secondary | ICD-10-CM | POA: Diagnosis not present

## 2020-11-22 DIAGNOSIS — M25562 Pain in left knee: Secondary | ICD-10-CM | POA: Diagnosis not present

## 2020-12-13 DIAGNOSIS — Z86010 Personal history of colonic polyps: Secondary | ICD-10-CM | POA: Diagnosis not present

## 2020-12-13 DIAGNOSIS — D123 Benign neoplasm of transverse colon: Secondary | ICD-10-CM | POA: Diagnosis not present

## 2020-12-15 DIAGNOSIS — D123 Benign neoplasm of transverse colon: Secondary | ICD-10-CM | POA: Diagnosis not present

## 2021-03-15 DIAGNOSIS — M1612 Unilateral primary osteoarthritis, left hip: Secondary | ICD-10-CM | POA: Diagnosis not present

## 2021-04-11 ENCOUNTER — Telehealth: Payer: Self-pay | Admitting: Family Medicine

## 2021-04-11 NOTE — Telephone Encounter (Signed)
I have placed this in the hold file at my desk, pt will need surgical clearance appt in order to have this filled out.

## 2021-04-11 NOTE — Telephone Encounter (Signed)
Pt has been scheduled for 04/16/21

## 2021-04-11 NOTE — Telephone Encounter (Signed)
I have placed a preoperative eval form in the bin upfront with a charge sheet.

## 2021-04-16 ENCOUNTER — Ambulatory Visit (INDEPENDENT_AMBULATORY_CARE_PROVIDER_SITE_OTHER): Payer: PPO | Admitting: Family Medicine

## 2021-04-16 ENCOUNTER — Encounter: Payer: Self-pay | Admitting: Family Medicine

## 2021-04-16 VITALS — BP 128/70 | HR 58 | Temp 97.9°F | Resp 15 | Ht 70.0 in | Wt 169.2 lb

## 2021-04-16 DIAGNOSIS — Z01818 Encounter for other preprocedural examination: Secondary | ICD-10-CM

## 2021-04-16 DIAGNOSIS — E785 Hyperlipidemia, unspecified: Secondary | ICD-10-CM

## 2021-04-16 LAB — CBC WITH DIFFERENTIAL/PLATELET
Basophils Absolute: 0 10*3/uL (ref 0.0–0.1)
Basophils Relative: 0.7 % (ref 0.0–3.0)
Eosinophils Absolute: 0.3 10*3/uL (ref 0.0–0.7)
Eosinophils Relative: 4.6 % (ref 0.0–5.0)
HCT: 45.8 % (ref 39.0–52.0)
Hemoglobin: 14.6 g/dL (ref 13.0–17.0)
Lymphocytes Relative: 31.1 % (ref 12.0–46.0)
Lymphs Abs: 1.8 10*3/uL (ref 0.7–4.0)
MCHC: 31.8 g/dL (ref 30.0–36.0)
MCV: 87.8 fl (ref 78.0–100.0)
Monocytes Absolute: 0.5 10*3/uL (ref 0.1–1.0)
Monocytes Relative: 9 % (ref 3.0–12.0)
Neutro Abs: 3.2 10*3/uL (ref 1.4–7.7)
Neutrophils Relative %: 54.6 % (ref 43.0–77.0)
Platelets: 275 10*3/uL (ref 150.0–400.0)
RBC: 5.22 Mil/uL (ref 4.22–5.81)
RDW: 15.5 % (ref 11.5–15.5)
WBC: 5.8 10*3/uL (ref 4.0–10.5)

## 2021-04-16 LAB — LIPID PANEL
Cholesterol: 143 mg/dL (ref 0–200)
HDL: 50.5 mg/dL (ref >39.00)
LDL Cholesterol: 82 mg/dL (ref 0–99)
NonHDL: 92.77
Total CHOL/HDL Ratio: 3
Triglycerides: 56 mg/dL (ref 0.0–149.0)
VLDL: 11.2 mg/dL (ref 0.0–40.0)

## 2021-04-16 LAB — COMPREHENSIVE METABOLIC PANEL
ALT: 17 U/L (ref 0–53)
AST: 26 U/L (ref 0–37)
Albumin: 4.3 g/dL (ref 3.5–5.2)
Alkaline Phosphatase: 67 U/L (ref 39–117)
BUN: 14 mg/dL (ref 6–23)
CO2: 29 mEq/L (ref 19–32)
Calcium: 8.8 mg/dL (ref 8.4–10.5)
Chloride: 105 mEq/L (ref 96–112)
Creatinine, Ser: 0.99 mg/dL (ref 0.40–1.50)
GFR: 73.74 mL/min (ref >60.00)
Glucose, Bld: 87 mg/dL (ref 70–99)
Potassium: 4.2 mEq/L (ref 3.5–5.1)
Sodium: 140 mEq/L (ref 135–145)
Total Bilirubin: 0.6 mg/dL (ref 0.2–1.2)
Total Protein: 6.8 g/dL (ref 6.0–8.3)

## 2021-04-16 LAB — URINALYSIS
Bilirubin Urine: NEGATIVE
Hgb urine dipstick: NEGATIVE
Ketones, ur: NEGATIVE
Leukocytes,Ua: NEGATIVE
Nitrite: NEGATIVE
Specific Gravity, Urine: 1.02 (ref 1.000–1.030)
Total Protein, Urine: NEGATIVE
Urine Glucose: 100 — AB
Urobilinogen, UA: 0.2 (ref 0.0–1.0)
pH: 6.5 (ref 5.0–8.0)

## 2021-04-16 LAB — PROTIME-INR
INR: 1 ratio (ref 0.8–1.0)
Prothrombin Time: 10.8 s (ref 9.6–13.1)

## 2021-04-16 LAB — HEMOGLOBIN A1C: Hgb A1c MFr Bld: 6 % (ref 4.6–6.5)

## 2021-04-16 MED ORDER — ATORVASTATIN CALCIUM 10 MG PO TABS: ORAL_TABLET | ORAL | 1 refills | Status: DC

## 2021-04-16 NOTE — Progress Notes (Signed)
Subjective:  Patient ID: Jesse Riddle, male    DOB: 07-24-1945  Age: 76 y.o. MRN: [id number]  CC:  Chief Complaint  Patient presents with   Medical Clearance    Pt here for clearance, Lt hip replacement 05/02/21 pt     HPI Jesse Riddle presents for   Preoperative evaluation for left hip replacement  Surgical date February 1, Dr. Wynelle Link.  On ibuprofen 4 per day for pain.  Intermediate risk surgery. General anesthesia. No recent anesthesia or surgery.  No known history of CVA, TIA, CHF, ischemic cardiac disease, renal insufficiency, or diabetes.  RCRI score 0. Last annual exam in June 2022. Peak activity/exercise: walking 2 miles.  Denies chest pain or dyspnea on exertion including with 2 flights of stairs.  No hx of OSA.  Denies new urinary symptoms, chronic nocturia - 2-3 times, no changes. Declines meds at this time. Does drink water before bed.   Hyperlipidemia: Coronary calcium score of 196 in June 2020.  Option of higher dosing Lipitor has been discussed previously but LDL stable at 77 in July of last year.  He does take aspirin 81 mg daily. Plans on holding 5-7 days prior.  Lab Results  Component Value Date   CHOL 141 09/29/2020   HDL 49.90 09/29/2020   LDLCALC 77 09/29/2020   TRIG 68.0 09/29/2020   CHOLHDL 3 09/29/2020   Lab Results  Component Value Date   ALT 21 09/29/2020   AST 37 09/29/2020   ALKPHOS 57 09/29/2020   BILITOT 0.7 09/29/2020         History There are no problems to display for this patient.  Past Medical History:  Diagnosis Date   Allergy    penicillin   No past surgical history on file. Allergies  Allergen Reactions   Penicillins Other (See Comments)    child   Prior to Admission medications   Medication Sig Start Date End Date Taking? Authorizing Provider  aspirin 81 MG tablet Take 81 mg by mouth daily.   Yes [provider]  atorvastatin (LIPITOR) 10 MG tablet Take 1 tablet by mouth once daily at 6pm 09/28/20  Yes  Wendie Agreste, MD  fish oil-omega-3 fatty acids 1000 MG capsule Take 1 g by mouth daily.   Yes [provider]  Turmeric (QC TUMERIC COMPLEX PO) Take by mouth.   Yes [provider]  [place] taking daily Patient not taking: Reported on 09/28/2020    [provider]   Social History   Socioeconomic History   Marital status: Married    Spouse name: Not on file   Number of children: 3   Years of education: Not on file   Highest education level: Bachelor's degree (e.g., BA, AB, BS)  Occupational History   Occupation: Retired    Fish farm manager: Retail banker  Tobacco Use   Smoking status: Never   Smokeless tobacco: Never  Vaping Use   Vaping Use: Never used  Substance and Sexual Activity   Alcohol use: Yes    Alcohol/week: 0.0 standard drinks    Comment: Occasional   Drug use: No   Sexual activity: Yes  Other Topics Concern   Not on file  Social History Narrative   Married. Education: [employer]. Exercise: walk/bike 3 times a week for 30 minutes.   Social Determinants of Health   Financial Resource Strain: Not on file  Food Insecurity: Not on file  Transportation Needs: Not on file  Physical  Activity: Not on file  Stress: Not on file  Social Connections: Not on file  Intimate Partner Violence: Not on file    Review of Systems  Constitutional:  Negative for fatigue and unexpected weight change.  Eyes:  Negative for visual disturbance.  Respiratory:  Negative for cough, chest tightness and shortness of breath.   Cardiovascular:  Negative for chest pain, palpitations and leg swelling.  Gastrointestinal:  Negative for abdominal pain and blood in stool.  Neurological:  Negative for dizziness, light-headedness and headaches.    Objective:   Vitals:   04/16/21 0805  BP: 128/70  Pulse: (!) 58  Resp: 15  Temp: 97.9 F (36.6 C)  TempSrc: Temporal  SpO2: 95%  Weight: 169 lb 3.2 oz (76.7 kg)  Height: 5\' 10"   (1.778 m)    Physical Exam Vitals reviewed.  Constitutional:      Appearance: He is well-developed.  HENT:     Head: Normocephalic and atraumatic.  Neck:     Vascular: No carotid bruit or JVD.  Cardiovascular:     Rate and Rhythm: Normal rate and regular rhythm.     Heart sounds: Normal heart sounds. No murmur heard. Pulmonary:     Effort: Pulmonary effort is normal.     Breath sounds: Normal breath sounds. No rales.  Musculoskeletal:     Right lower leg: No edema.     Left lower leg: No edema.  Skin:    General: Skin is warm and dry.  Neurological:     Mental Status: He is alert and oriented to person, place, and time.  Psychiatric:        Mood and Affect: Mood normal.   EKG sinus rhythm, sinus bradycardia with rate 55.  RSR in V1, also seen on previous EKG in December 2016.  No appreciable changes since that time, no acute ST or T wave changes.  Assessment & Plan:  Jesse Riddle is a 76 y.o. male . Preoperative evaluation to rule out surgical contraindication - Plan: CBC with Differential/Platelet, Protime-INR, Hemoglobin A1c, Urinalysis, EKG 12-Lead, DG Chest 2 View, Comprehensive metabolic panel, CANCELED: Basic metabolic panel, CANCELED: Albumin  -No apparent surgical contraindications.  No apparent increased risk of major adverse cardiac event.  EKG appears similar without acute changes.  Check labs above, x-ray.  Anticipate paperwork for surgeon soon.  Does plan to hold aspirin for 5 to 7 days prior to surgery which is reasonable.  Hyperlipidemia, unspecified hyperlipidemia type - Plan: Comprehensive metabolic panel, Lipid panel, atorvastatin (LIPITOR) 10 MG tablet  -  Stable, tolerating current regimen. Medications refilled. Labs pending as above.    Meds ordered this encounter  Medications   atorvastatin (LIPITOR) 10 MG tablet    Sig: Take 1 tablet by mouth once daily at 6pm    Dispense:  90 tablet    Refill:  1    Ok to place on hold if needed.   Patient  Instructions  Once I have you x-ray and lab results I can complete your paperwork for surgery.  At this time I do not see any specific restrictions.  No change on cholesterol medication at this time.  I am checking those labs today.  Try to restrict fluids before bedtime, but drink sufficient water throughout the day.  If continued nighttime urination I am happy to follow-up with you to discuss treatment options or further evaluation.  Thanks for coming in today and take care.     Signed,   [name], MD  [place] 04/16/21 8:44 AM

## 2021-04-16 NOTE — Patient Instructions (Signed)
Once I have you x-ray and lab results I can complete your paperwork for surgery.  At this time I do not see any specific restrictions.  No change on cholesterol medication at this time.  I am checking those labs today.  Try to restrict fluids before bedtime, but drink sufficient water throughout the day.  If continued nighttime urination I am happy to follow-up with you to discuss treatment options or further evaluation.  Thanks for coming in today and take care.

## 2021-04-20 ENCOUNTER — Other Ambulatory Visit: Payer: Self-pay

## 2021-04-20 ENCOUNTER — Ambulatory Visit (INDEPENDENT_AMBULATORY_CARE_PROVIDER_SITE_OTHER)
Admission: RE | Admit: 2021-04-20 | Discharge: 2021-04-20 | Disposition: A | Discharge status: Home / Self Care | Payer: PPO | Source: Ambulatory Visit | Attending: Family Medicine | Admitting: Family Medicine

## 2021-04-20 DIAGNOSIS — Z01818 Encounter for other preprocedural examination: Secondary | ICD-10-CM | POA: Diagnosis not present

## 2021-04-24 NOTE — Progress Notes (Addendum)
COVID swab appointment:04/30/21 @ 0845  COVID Vaccine Completed: yes x2 Date COVID Vaccine completed: 04/21/19, 05/14/19 Has received booster: COVID vaccine manufacturer: Pfizer      Date of COVID positive in last 90 days: no  PCP - [name], MD Cardiologist - [place] clearance 04/16/21 by [name] in Epic   Chest x-ray - 04/20/21 Epic EKG - 04/16/21 Epic Stress Test - n/a ECHO - n/a Cardiac Cath - n/a Pacemaker/ICD device last checked: n/a Spinal Cord Stimulator: n/a  Sleep Study - n/a CPAP -   Fasting Blood Sugar - n/a Checks Blood Sugar _____ times a day  Blood Thinner Instructions: Aspirin Instructions: ASA 81, hold 5-7 days Last Dose: 04/22/21  Activity level: Can go up a flight of stairs and perform activities of daily living without stopping and without symptoms of chest pain     Anesthesia review:   Patient denies shortness of breath, fever, cough and chest pain at PAT appointment   Patient verbalized understanding of instructions that were given to them at the PAT appointment. Patient was also instructed that they will need to review over the PAT instructions again at home before surgery.

## 2021-04-24 NOTE — Patient Instructions (Addendum)
DUE TO COVID-19 ONLY ONE VISITOR IS ALLOWED TO COME WITH YOU AND STAY IN THE WAITING ROOM ONLY DURING PRE OP AND PROCEDURE.   **NO VISITORS ARE ALLOWED IN THE SHORT STAY AREA OR RECOVERY ROOM!!**  IF YOU WILL BE ADMITTED INTO THE HOSPITAL YOU ARE ALLOWED ONLY TWO SUPPORT PEOPLE DURING VISITATION HOURS ONLY (7 AM -8PM)   The support person(s) must pass our screening, gel in and out, and wear a mask at all times, including in the patients room. Patients must also wear a mask when staff or their support person are in the room. Visitors GUEST BADGE MUST BE WORN VISIBLY  One adult visitor may remain with you overnight and MUST be in the room by 8 P.M.  No visitors under the age of 39. Any visitor under the age of 48 must be accompanied by an adult.    COVID SWAB TESTING MUST BE COMPLETED ON:  04/30/21 @ 8:45 AM   Site: [facility] [place] Enter: Main Entrance have a seat in the waiting area to the right of main entrance (DO NOT [place]!!!!!) Dial: [phone] to alert staff you have arrived  You are not required to quarantine, however you are required to wear a well-fitted mask when you are out and around people not in your household.  Hand Hygiene often Do NOT share personal items Notify your provider if you are in close contact with someone who has COVID or you develop fever 100.4 or greater, new onset of sneezing, cough, sore throat, shortness of breath or body aches.       Your procedure is scheduled on: 05/02/21    Report to [facility] Main Entrance    Report to admitting at 7:25 AM   Call this number if you have problems the morning of surgery [phone]   Do not eat food :After Midnight.   May have liquids until 7:10 AM day of surgery  CLEAR LIQUID DIET  Foods Allowed                                                                     Foods Excluded  Water, Black Coffee and tea (no milk or creamer)           liquids  that you cannot  Plain Jell-O in any flavor  (No red)                                    see through such as: Fruit ices (not with fruit pulp)                                            milk, soups, orange juice              Iced Popsicles (No red)  All solid food                                   Apple juices Sports drinks like Gatorade (No red) Lightly seasoned clear broth or consume(fat free) Sugar    The day of surgery:  Drink ONE (1) Pre-Surgery Clear Ensure at 7:10 AM the morning of surgery. Drink in one sitting. Do not sip.  This drink was given to you during your hospital  pre-op appointment visit. Nothing else to drink after completing the  Pre-Surgery Clear Ensure.          If you have questions, please contact your surgeons office.     Oral Hygiene is also important to reduce your risk of infection.                                    Remember - BRUSH YOUR TEETH THE MORNING OF SURGERY WITH YOUR REGULAR TOOTHPASTE   Stop all vitamins and supplements 7 days before surgery.   Take these medicines the morning of surgery with A SIP OF WATER: None                              You may not have any metal on your body including hair pins, jewelry, and body piercing             Do not wear make-up, lotions, powders, perfumes, or deodorant  Do not wear nail polish including gel and S&S, artificial/acrylic nails, or any other type of covering on natural nails including finger and toenails. If you have artificial nails, gel coating, etc. that needs to be removed by a nail salon please have this removed prior to surgery or surgery may need to be canceled/ delayed if the surgeon/ anesthesia feels like they are unable to be safely monitored.   Do not shave  48 hours prior to surgery.    Do not bring valuables to the hospital. [place].   Bring small overnight bag day of surgery.   Special  Instructions: Bring a copy of your healthcare power of attorney and living will documents         the day of surgery if you haven't scanned them before.              Please read over the following fact sheets you were given: IF YOU HAVE QUESTIONS ABOUT YOUR PRE-OP INSTRUCTIONS PLEASE CALL [place] - Preparing for Surgery Before surgery, you can play an important role.  Because skin is not sterile, your skin needs to be as free of germs as possible.  You can reduce the number of germs on your skin by washing with CHG (chlorahexidine gluconate) soap before surgery.  CHG is an antiseptic cleaner which kills germs and bonds with the skin to continue killing germs even after washing. Please DO NOT use if you have an allergy to CHG or antibacterial soaps.  If your skin becomes reddened/irritated stop using the CHG and inform your nurse when you arrive at Short Stay. Do not shave (including legs and underarms) for at least 48 hours prior to the first CHG shower.  You may shave your face/neck.  Please follow these instructions carefully:  1.  Shower with CHG Soap the night before surgery and the  morning of surgery.  2.  If you choose to wash your hair, wash your hair first as usual with your normal  shampoo.  3.  After you shampoo, rinse your hair and body thoroughly to remove the shampoo.                             4.  Use CHG as you would any other liquid soap.  You can apply chg directly to the skin and wash.  Gently with a scrungie or clean washcloth.  5.  Apply the CHG Soap to your body ONLY FROM THE NECK DOWN.   Do   not use on face/ open                           Wound or open sores. Avoid contact with eyes, ears mouth and   genitals (private parts).                       Wash face,  Genitals (private parts) with your normal soap.             6.  Wash thoroughly, paying special attention to the area where your    surgery  will be performed.  7.  Thoroughly rinse your  body with warm water from the neck down.  8.  DO NOT shower/wash with your normal soap after using and rinsing off the CHG Soap.                9.  Pat yourself dry with a clean towel.            10.  Wear clean pajamas.            11.  Place clean sheets on your bed the night of your first shower and do not  sleep with pets. Day of Surgery : Do not apply any lotions/deodorants the morning of surgery.  Please wear clean clothes to the hospital/surgery center.  FAILURE TO FOLLOW THESE INSTRUCTIONS MAY RESULT IN THE CANCELLATION OF YOUR SURGERY  PATIENT SIGNATURE_________________________________  NURSE SIGNATURE__________________________________  ________________________________________________________________________   Adam Phenix  An incentive spirometer is a tool that can help keep your lungs clear and active. This tool measures how well you are filling your lungs with each breath. Taking long deep breaths may help reverse or decrease the chance of developing breathing (pulmonary) problems (especially infection) following: A long period of time when you are unable to move or be active. BEFORE THE PROCEDURE  If the spirometer includes an indicator to show your best effort, your nurse or respiratory therapist will set it to a desired goal. If possible, sit up straight or lean slightly forward. Try not to slouch. Hold the incentive spirometer in an upright position. INSTRUCTIONS FOR USE  Sit on the edge of your bed if possible, or sit up as far as you can in bed or on a chair. Hold the incentive spirometer in an upright position. Breathe out normally. Place the mouthpiece in your mouth and seal your lips tightly around it. Breathe in slowly and as deeply as possible, raising the piston or the ball toward the top of the column. Hold your breath for 3-5 seconds or for as long as possible.  Allow the piston or ball to fall to the bottom of the column. Remove the mouthpiece from your  mouth and breathe out normally. Rest for a few seconds and repeat Steps 1 through 7 at least 10 times every 1-2 hours when you are awake. Take your time and take a few normal breaths between deep breaths. The spirometer may include an indicator to show your best effort. Use the indicator as a goal to work toward during each repetition. After each set of 10 deep breaths, practice coughing to be sure your lungs are clear. If you have an incision (the cut made at the time of surgery), support your incision when coughing by placing a pillow or rolled up towels firmly against it. Once you are able to get out of bed, walk around indoors and cough well. You may stop using the incentive spirometer when instructed by your caregiver.  RISKS AND COMPLICATIONS Take your time so you do not get dizzy or light-headed. If you are in pain, you may need to take or ask for pain medication before doing incentive spirometry. It is harder to take a deep breath if you are having pain. AFTER USE Rest and breathe slowly and easily. It can be helpful to keep track of a log of your progress. Your caregiver can provide you with a simple table to help with this. If you are using the spirometer at home, follow these instructions: [place] IF:  You are having difficultly using the spirometer. You have trouble using the spirometer as often as instructed. Your pain medication is not giving enough relief while using the spirometer. You develop fever of 100.5 F (38.1 C) or higher. SEEK IMMEDIATE MEDICAL CARE IF:  You cough up bloody sputum that had not been present before. You develop fever of 102 F (38.9 C) or greater. You develop worsening pain at or near the incision site. MAKE SURE YOU:  Understand these instructions. Will watch your condition. Will get help right away if you are not doing well or get worse. Document Released: 07/29/2006 Document Revised: 06/10/2011 Document Reviewed: 09/29/2006 ExitCare  Patient Information 2014 ExitCare, [place].   ________________________________________________________________________  WHAT IS A BLOOD TRANSFUSION? Blood Transfusion Information  A transfusion is the replacement of blood or some of its parts. Blood is made up of multiple cells which provide different functions. Red blood cells carry oxygen and are used for blood loss replacement. White blood cells fight against infection. Platelets control bleeding. Plasma helps clot blood. Other blood products are available for specialized needs, such as hemophilia or other clotting disorders. BEFORE THE TRANSFUSION  Who gives blood for transfusions?  Healthy volunteers who are fully evaluated to make sure their blood is safe. This is blood bank blood. Transfusion therapy is the safest it has ever been in the practice of medicine. Before blood is taken from a donor, a complete history is taken to make sure that person has no history of diseases nor engages in risky social behavior (examples are intravenous drug use or sexual activity with multiple partners). The donor's travel history is screened to minimize risk of transmitting infections, such as malaria. The donated blood is tested for signs of infectious diseases, such as HIV and hepatitis. The blood is then tested to be sure it is compatible with you in order to minimize the chance of a transfusion reaction. If you or a relative donates blood, this is often done in anticipation of surgery and is not appropriate for emergency situations. It  takes many days to process the donated blood. RISKS AND COMPLICATIONS Although transfusion therapy is very safe and saves many lives, the main dangers of transfusion include:  Getting an infectious disease. Developing a transfusion reaction. This is an allergic reaction to something in the blood you were given. Every precaution is taken to prevent this. The decision to have a blood transfusion has been considered carefully  by your caregiver before blood is given. Blood is not given unless the benefits outweigh the risks. AFTER THE TRANSFUSION Right after receiving a blood transfusion, you will usually feel much better and more energetic. This is especially true if your red blood cells have gotten low (anemic). The transfusion raises the level of the red blood cells which carry oxygen, and this usually causes an energy increase. The nurse administering the transfusion will monitor you carefully for complications. HOME CARE INSTRUCTIONS  No special instructions are needed after a transfusion. You may find your energy is better. Speak with your caregiver about any limitations on activity for underlying diseases you may have. SEEK MEDICAL CARE IF:  Your condition is not improving after your transfusion. You develop redness or irritation at the intravenous (IV) site. SEEK IMMEDIATE MEDICAL CARE IF:  Any of the following symptoms occur over the next 12 hours: Shaking chills. You have a temperature by mouth above 102 F (38.9 C), not controlled by medicine. Chest, back, or muscle pain. People around you feel you are not acting correctly or are confused. Shortness of breath or difficulty breathing. Dizziness and fainting. You get a rash or develop hives. You have a decrease in urine output. Your urine turns a dark color or changes to pink, red, or brown. Any of the following symptoms occur over the next 10 days: You have a temperature by mouth above 102 F (38.9 C), not controlled by medicine. Shortness of breath. Weakness after normal activity. The white part of the eye turns yellow (jaundice). You have a decrease in the amount of urine or are urinating less often. Your urine turns a dark color or changes to pink, red, or brown. Document Released: 03/15/2000 Document Revised: 06/10/2011 Document Reviewed: 11/02/2007 [facility] Patient Information 2014 [place],  [place].  _______________________________________________________________________

## 2021-04-25 ENCOUNTER — Encounter (HOSPITAL_COMMUNITY): Payer: Self-pay

## 2021-04-25 ENCOUNTER — Other Ambulatory Visit: Payer: Self-pay

## 2021-04-25 ENCOUNTER — Encounter (HOSPITAL_COMMUNITY)
Admission: RE | Admit: 2021-04-25 | Discharge: 2021-04-25 | Disposition: A | Discharge status: Home / Self Care | Payer: PPO | Source: Ambulatory Visit | Attending: Orthopedic Surgery | Admitting: Orthopedic Surgery

## 2021-04-25 VITALS — BP 144/92 | HR 93 | Temp 97.6°F | Resp 14 | Ht 70.0 in | Wt 164.6 lb

## 2021-04-25 DIAGNOSIS — Z01818 Encounter for other preprocedural examination: Secondary | ICD-10-CM

## 2021-04-25 DIAGNOSIS — Z01812 Encounter for preprocedural laboratory examination: Secondary | ICD-10-CM | POA: Diagnosis not present

## 2021-04-25 DIAGNOSIS — M1612 Unilateral primary osteoarthritis, left hip: Secondary | ICD-10-CM

## 2021-04-25 HISTORY — DX: Personal history of urinary calculi: Z87.442

## 2021-04-25 LAB — SURGICAL PCR SCREEN
MRSA, PCR: NEGATIVE
Staphylococcus aureus: NEGATIVE

## 2021-04-26 NOTE — H&P (Addendum)
TOTAL HIP ADMISSION H&P  Patient is admitted for left total hip arthroplasty.  Subjective:  Chief Complaint: Left hip pain  HPI: Jesse Riddle, 76 y.o. male, has a history of pain and functional disability in the left hip due to arthritis and patient has failed non-surgical conservative treatments for greater than 12 weeks to include NSAID's and/or analgesics, flexibility and strengthening excercises, and activity modification. Onset of symptoms was gradual, starting  several  years ago with gradually worsening course since that time. The patient noted no past surgery on the left hip. Patient currently rates pain in the left hip at 7 out of 10 with activity. Patient has worsening of pain with activity and weight bearing, pain that interfers with activities of daily living, and pain with passive range of motion. Patient has evidence of periarticular osteophytes and joint space narrowing by imaging studies. This condition presents safety issues increasing the risk of falls. There is no current active infection.  There are no problems to display for this patient.   Past Medical History:  Diagnosis Date   Allergy    penicillin   History of kidney stones     Past Surgical History:  Procedure Laterality Date   COLONOSCOPY     FINGER SURGERY     TONSILLECTOMY      Prior to Admission medications   Medication Sig Start Date End Date Taking? Authorizing Provider  aspirin 81 MG tablet Take 81 mg by mouth daily.   Yes [provider]  atorvastatin (LIPITOR) 10 MG tablet Take 1 tablet by mouth once daily at 6pm 04/16/21  Yes Wendie Agreste, MD  Coenzyme Q10 (CO Q-10 PO) Take 1 capsule by mouth daily.   Yes [provider]  fish oil-omega-3 fatty acids 1000 MG capsule Take 1 g by mouth daily.   Yes [provider]  Turmeric (QC TUMERIC COMPLEX PO) Take 1 capsule by mouth daily.   Yes [provider]    Allergies  Allergen Reactions   Penicillins Other  (See Comments)    child    Social History   Socioeconomic History   Marital status: Married    Spouse name: Not on file   Number of children: 3   Years of education: Not on file   Highest education level: Bachelor's degree (e.g., BA, AB, BS)  Occupational History   Occupation: Retired    Fish farm manager: Retail banker  Tobacco Use   Smoking status: Never   Smokeless tobacco: Never  Vaping Use   Vaping Use: Never used  Substance and Sexual Activity   Alcohol use: Yes    Alcohol/week: 0.0 standard drinks    Comment: Occasional   Drug use: No   Sexual activity: Yes  Other Topics Concern   Not on file  Social History Narrative   Married. Education: [employer]. Exercise: walk/bike 3 times a week for 30 minutes.   Social Determinants of Health   Financial Resource Strain: Not on file  Food Insecurity: Not on file  Transportation Needs: Not on file  Physical Activity: Not on file  Stress: Not on file  Social Connections: Not on file  Intimate Partner Violence: Not on file    Tobacco Use: Low Risk    Smoking Tobacco Use: Never   Smokeless Tobacco Use: Never   Passive Exposure: Not on file   Social History   Substance and Sexual Activity  Alcohol Use Yes   Alcohol/week: 0.0 standard drinks   Comment: Occasional    Family  History  Problem Relation Age of Onset   Stroke Mother    Mental illness Father    Heart disease Brother    Heart disease Brother     ROS: Constitutional: no fever, no chills, no night sweats, no significant weight loss Cardiovascular: no chest pain, no palpitations Respiratory: no cough, no shortness of breath, No COPD Gastrointestinal: no vomiting, no nausea Musculoskeletal: no swelling in Joints, Joint Pain Neurologic: no numbness, no tingling, no difficulty with balance    Objective:  Physical Exam: Well nourished and well developed.  General: Alert and oriented x3, cooperative and pleasant, no acute distress.  Head: normocephalic,  atraumatic, neck supple.  Eyes: EOMI.  Respiratory: breath sounds clear in all fields, no wheezing, rales, or rhonchi. Cardiovascular: Regular rate and rhythm, no murmurs, gallops or rubs.  Abdomen: non-tender to palpation and soft, normoactive bowel sounds. Musculoskeletal:  The patient has a minimally antalgic gait pattern favoring the left side without the use of assistive devices.     Right Hip Exam:   The range of motion: Flexion to 100 degrees, Internal Rotation to 0 degrees, External Rotation to 10 degrees, and abduction to 10 to 20 degrees without discomfort.   There is no tenderness over the greater trochanteric bursa.     Left Hip Exam:   The range of motion: normal without discomfort.   There is no tenderness over the greater trochanteric bursa.   Calves soft and nontender. Motor function intact in LE. Strength 5/5 LE bilaterally. Neuro: Distal pulses 2+. Sensation to light touch intact in LE.    Vital signs in last 24 hours: Temp:  [97.6 F (36.4 C)] 97.6 F (36.4 C) (01/25 1102) Pulse Rate:  [93] 93 (01/25 1102) Resp:  [14] 14 (01/25 1102) BP: (144)/(92) 144/92 (01/25 1102) SpO2:  [99 %] 99 % (01/25 1102) Weight:  [74.7 kg] 74.7 kg (01/25 1102)  Imaging Review AP pelvis, AP and lateral of the left hip dated 10/2020 demonstrate bone-on-bone arthritis with subchondral cysts and osteophyte formation. He has a normal-appearing right hip.  Assessment/Plan:  End stage arthritis, left hip  The patient history, physical examination, clinical judgement of the provider and imaging studies are consistent with end stage degenerative joint disease of the left hip and total hip arthroplasty is deemed medically necessary. The treatment options including medical management, injection therapy, arthroscopy and arthroplasty were discussed at length. The risks and benefits of total hip arthroplasty were presented and reviewed. The risks due to aseptic loosening, infection, stiffness,  dislocation/subluxation, thromboembolic complications and other imponderables were discussed. The patient acknowledged the explanation, agreed to proceed with the plan and consent was signed. Patient is being admitted for inpatient treatment for surgery, pain control, PT, OT, prophylactic antibiotics, VTE prophylaxis, progressive ambulation and ADLs and discharge planning.The patient is planning to be discharged  home .   Patient's anticipated LOS is less than 2 midnights, meeting these requirements: - Younger than 56 - Lives within 1 hour of care - Has a competent adult at home to recover with post-op recover - NO history of  - Chronic pain requiring opiods  - Diabetes  - Coronary Artery Disease  - Heart failure  - Heart attack  - Stroke  - DVT/VTE  - Cardiac arrhythmia  - Respiratory Failure/COPD  - Renal failure  - Anemia  - Advanced Liver disease    Therapy Plans: HEP Disposition: Home with Wife Planned DVT Prophylaxis: Aspirin 325mg  DME Needed: None PCP: Dr. Janeann Forehand - [facility] [place] (  Clearance not received - patient contacting) TXA: IV Allergies: PCN - Fainted after antibiotic injection at age 8. Anesthesia Concerns: None BMI: 24 Last HgbA1c: N/A  Pharmacy: [facility]  - Patient was instructed on what medications to stop prior to surgery. - Follow-up visit in 2 weeks with Dr. Wynelle Link - Begin physical therapy following surgery - Pre-operative lab work as pre-surgical testing - Prescriptions will be provided in hospital at time of discharge  Fenton Foy, [facility], PA-C Orthopedic Surgery [facility] [place]

## 2021-04-30 ENCOUNTER — Other Ambulatory Visit: Payer: Self-pay

## 2021-04-30 ENCOUNTER — Encounter (HOSPITAL_COMMUNITY)
Admission: RE | Admit: 2021-04-30 | Discharge: 2021-04-30 | Disposition: A | Discharge status: Home / Self Care | Payer: PPO | Source: Ambulatory Visit | Attending: Orthopedic Surgery | Admitting: Orthopedic Surgery

## 2021-04-30 DIAGNOSIS — Z20822 Contact with and (suspected) exposure to covid-19: Secondary | ICD-10-CM | POA: Insufficient documentation

## 2021-04-30 DIAGNOSIS — Z01812 Encounter for preprocedural laboratory examination: Secondary | ICD-10-CM | POA: Insufficient documentation

## 2021-04-30 LAB — SARS CORONAVIRUS 2 (TAT 6-24 HRS): SARS Coronavirus 2: NEGATIVE

## 2021-05-01 NOTE — Anesthesia Preprocedure Evaluation (Addendum)
Anesthesia Evaluation  Patient identified by MRN, date of birth, ID band Patient awake    Reviewed: Allergy & Precautions, NPO status , Patient's Chart, lab work & pertinent test results  Airway Mallampati: II  TM Distance: >3 FB Neck ROM: Full    Dental no notable dental hx.    Pulmonary neg pulmonary ROS,    Pulmonary exam normal        Cardiovascular  Rhythm:Regular Rate:Normal  HLD   Neuro/Psych negative neurological ROS  negative psych ROS   GI/Hepatic negative GI ROS, Neg liver ROS,   Endo/Other  negative endocrine ROS  Renal/GU negative Renal ROS  negative genitourinary   Musculoskeletal  (+) Arthritis , Osteoarthritis,    Abdominal Normal abdominal exam  (+)   Peds  Hematology negative hematology ROS (+)   Anesthesia Other Findings   Reproductive/Obstetrics                            Anesthesia Physical Anesthesia Plan  ASA: 2  Anesthesia Plan: MAC and Spinal   Post-op Pain Management:    Induction: Intravenous  PONV Risk Score and Plan: 1 and Ondansetron, Dexamethasone, Propofol infusion and Treatment may vary due to age or medical condition  Airway Management Planned: Simple Face Mask, Natural Airway and Nasal Cannula  Additional Equipment: None  Intra-op Plan:   Post-operative Plan:   Informed Consent: I have reviewed the patients History and Physical, chart, labs and discussed the procedure including the risks, benefits and alternatives for the proposed anesthesia with the patient or authorized representative who has indicated his/her understanding and acceptance.     Dental advisory given  Plan Discussed with: CRNA  Anesthesia Plan Comments: (Lab Results      Component                Value               Date                      WBC                      5.8                 04/16/2021                HGB                      14.6                04/16/2021                 HCT                      45.8                04/16/2021                MCV                      87.8                04/16/2021                PLT                      275.0  04/16/2021           Lab Results      Component                Value               Date                      NA                       140                 04/16/2021                K                        4.2                 04/16/2021                CO2                      29                  04/16/2021                GLUCOSE                  87                  04/16/2021                BUN                      14                  04/16/2021                CREATININE               0.99                04/16/2021                CALCIUM                  8.8                 04/16/2021                GFRNONAA                 78                  05/12/2019          )       Anesthesia Quick Evaluation

## 2021-05-02 ENCOUNTER — Observation Stay (HOSPITAL_COMMUNITY): Payer: PPO

## 2021-05-02 ENCOUNTER — Ambulatory Visit (HOSPITAL_COMMUNITY): Payer: PPO

## 2021-05-02 ENCOUNTER — Ambulatory Visit (HOSPITAL_COMMUNITY): Payer: PPO | Admitting: Anesthesiology

## 2021-05-02 ENCOUNTER — Observation Stay (HOSPITAL_COMMUNITY)
Admission: RE | Admit: 2021-05-02 | Discharge: 2021-05-03 | Disposition: A | Discharge status: Home / Self Care | Payer: PPO | Attending: Orthopedic Surgery | Admitting: Orthopedic Surgery

## 2021-05-02 ENCOUNTER — Other Ambulatory Visit: Payer: Self-pay

## 2021-05-02 ENCOUNTER — Encounter (HOSPITAL_COMMUNITY): Payer: Self-pay | Admitting: Orthopedic Surgery

## 2021-05-02 ENCOUNTER — Encounter (HOSPITAL_COMMUNITY)
Admission: RE | Disposition: A | Discharge status: Home / Self Care | Payer: Self-pay | Source: Home / Self Care | Attending: Orthopedic Surgery

## 2021-05-02 DIAGNOSIS — M1612 Unilateral primary osteoarthritis, left hip: Secondary | ICD-10-CM | POA: Diagnosis not present

## 2021-05-02 DIAGNOSIS — M169 Osteoarthritis of hip, unspecified: Secondary | ICD-10-CM | POA: Diagnosis present

## 2021-05-02 DIAGNOSIS — Z01812 Encounter for preprocedural laboratory examination: Secondary | ICD-10-CM

## 2021-05-02 DIAGNOSIS — Z419 Encounter for procedure for purposes other than remedying health state, unspecified: Secondary | ICD-10-CM

## 2021-05-02 DIAGNOSIS — Z96649 Presence of unspecified artificial hip joint: Secondary | ICD-10-CM

## 2021-05-02 DIAGNOSIS — Z7982 Long term (current) use of aspirin: Secondary | ICD-10-CM | POA: Diagnosis not present

## 2021-05-02 DIAGNOSIS — Z96642 Presence of left artificial hip joint: Secondary | ICD-10-CM | POA: Diagnosis not present

## 2021-05-02 DIAGNOSIS — Z471 Aftercare following joint replacement surgery: Secondary | ICD-10-CM | POA: Diagnosis not present

## 2021-05-02 DIAGNOSIS — Z79899 Other long term (current) drug therapy: Secondary | ICD-10-CM | POA: Diagnosis not present

## 2021-05-02 HISTORY — PX: TOTAL HIP ARTHROPLASTY: SHX124

## 2021-05-02 LAB — TYPE AND SCREEN
ABO/RH(D): A POS
Antibody Screen: NEGATIVE

## 2021-05-02 LAB — ABO/RH: ABO/RH(D): A POS

## 2021-05-02 SURGERY — ARTHROPLASTY, HIP, TOTAL, ANTERIOR APPROACH
Anesthesia: Monitor Anesthesia Care | Site: Hip | Laterality: Left

## 2021-05-02 MED ORDER — WATER FOR IRRIGATION, STERILE IR SOLN
Status: DC | PRN
  Administered 2021-05-02: 2000 mL

## 2021-05-02 MED ORDER — METOCLOPRAMIDE HCL 5 MG PO TABS: 5.0000 mg | ORAL_TABLET | Freq: Three times a day (TID) | ORAL | Status: DC | PRN

## 2021-05-02 MED ORDER — LIDOCAINE 2% (20 MG/ML) 5 ML SYRINGE
INTRAMUSCULAR | Status: DC | PRN
  Administered 2021-05-02: 40 mg via INTRAVENOUS

## 2021-05-02 MED ORDER — TRANEXAMIC ACID-NACL 1000-0.7 MG/100ML-% IV SOLN
1000.0000 mg | INTRAVENOUS | Status: AC
  Administered 2021-05-02: 1000 mg via INTRAVENOUS
  Filled 2021-05-02: qty 100

## 2021-05-02 MED ORDER — PROPOFOL 10 MG/ML IV BOLUS
INTRAVENOUS | Status: AC
  Filled 2021-05-02: qty 20

## 2021-05-02 MED ORDER — CHLORHEXIDINE GLUCONATE 0.12 % MT SOLN
15.0000 mL | Freq: Once | OROMUCOSAL | Status: AC
  Administered 2021-05-02: 15 mL via OROMUCOSAL

## 2021-05-02 MED ORDER — ACETAMINOPHEN 10 MG/ML IV SOLN
1000.0000 mg | Freq: Four times a day (QID) | INTRAVENOUS | Status: DC
  Administered 2021-05-02: 1000 mg via INTRAVENOUS
  Filled 2021-05-02: qty 100

## 2021-05-02 MED ORDER — MORPHINE SULFATE (PF) 2 MG/ML IV SOLN: 0.5000 mg | INTRAVENOUS | Status: DC | PRN

## 2021-05-02 MED ORDER — POLYETHYLENE GLYCOL 3350 17 G PO PACK: 17.0000 g | PACK | Freq: Every day | ORAL | Status: DC | PRN

## 2021-05-02 MED ORDER — ATORVASTATIN CALCIUM 10 MG PO TABS
10.0000 mg | ORAL_TABLET | Freq: Every day | ORAL | Status: DC
  Administered 2021-05-02: 10 mg via ORAL
  Filled 2021-05-02: qty 1

## 2021-05-02 MED ORDER — MENTHOL 3 MG MT LOZG: 1.0000 | LOZENGE | OROMUCOSAL | Status: DC | PRN

## 2021-05-02 MED ORDER — ACETAMINOPHEN 325 MG PO TABS
325.0000 mg | ORAL_TABLET | Freq: Four times a day (QID) | ORAL | Status: DC | PRN
  Administered 2021-05-02: 650 mg via ORAL
  Filled 2021-05-02: qty 2

## 2021-05-02 MED ORDER — PROPOFOL 1000 MG/100ML IV EMUL
INTRAVENOUS | Status: AC
  Filled 2021-05-02: qty 100

## 2021-05-02 MED ORDER — MIDAZOLAM HCL 2 MG/2ML IJ SOLN
INTRAMUSCULAR | Status: AC
  Filled 2021-05-02: qty 2

## 2021-05-02 MED ORDER — PHENOL 1.4 % MT LIQD: 1.0000 | OROMUCOSAL | Status: DC | PRN

## 2021-05-02 MED ORDER — FENTANYL CITRATE PF 50 MCG/ML IJ SOSY: 25.0000 ug | PREFILLED_SYRINGE | INTRAMUSCULAR | Status: DC | PRN

## 2021-05-02 MED ORDER — DEXAMETHASONE SODIUM PHOSPHATE 10 MG/ML IJ SOLN
INTRAMUSCULAR | Status: AC
  Filled 2021-05-02: qty 1

## 2021-05-02 MED ORDER — PROPOFOL 500 MG/50ML IV EMUL
INTRAVENOUS | Status: DC | PRN
  Administered 2021-05-02: 40 ug/kg/min via INTRAVENOUS

## 2021-05-02 MED ORDER — MIDAZOLAM HCL 2 MG/2ML IJ SOLN
INTRAMUSCULAR | Status: DC | PRN
  Administered 2021-05-02: 1 mg via INTRAVENOUS

## 2021-05-02 MED ORDER — METHOCARBAMOL 500 MG IVPB - SIMPLE MED
500.0000 mg | Freq: Four times a day (QID) | INTRAVENOUS | Status: DC | PRN
  Filled 2021-05-02: qty 50

## 2021-05-02 MED ORDER — FENTANYL CITRATE (PF) 100 MCG/2ML IJ SOLN
INTRAMUSCULAR | Status: AC
  Filled 2021-05-02: qty 2

## 2021-05-02 MED ORDER — METOCLOPRAMIDE HCL 5 MG/ML IJ SOLN: 5.0000 mg | Freq: Three times a day (TID) | INTRAMUSCULAR | Status: DC | PRN

## 2021-05-02 MED ORDER — HYDROCODONE-ACETAMINOPHEN 7.5-325 MG PO TABS: 1.0000 | ORAL_TABLET | ORAL | Status: DC | PRN

## 2021-05-02 MED ORDER — BUPIVACAINE HCL (PF) 0.25 % IJ SOLN
INTRAMUSCULAR | Status: AC
  Filled 2021-05-02: qty 30

## 2021-05-02 MED ORDER — SODIUM CHLORIDE 0.9 % IV SOLN: INTRAVENOUS | Status: DC

## 2021-05-02 MED ORDER — DOCUSATE SODIUM 100 MG PO CAPS
100.0000 mg | ORAL_CAPSULE | Freq: Two times a day (BID) | ORAL | Status: DC
  Administered 2021-05-02 – 2021-05-03 (×3): 100 mg via ORAL
  Filled 2021-05-02 (×3): qty 1

## 2021-05-02 MED ORDER — BUPIVACAINE-EPINEPHRINE (PF) 0.25% -1:200000 IJ SOLN
INTRAMUSCULAR | Status: AC
  Filled 2021-05-02: qty 30

## 2021-05-02 MED ORDER — ORAL CARE MOUTH RINSE: 15.0000 mL | Freq: Once | OROMUCOSAL | Status: AC

## 2021-05-02 MED ORDER — ASPIRIN EC 325 MG PO TBEC
325.0000 mg | DELAYED_RELEASE_TABLET | Freq: Two times a day (BID) | ORAL | Status: DC
  Administered 2021-05-03: 325 mg via ORAL
  Filled 2021-05-02: qty 1

## 2021-05-02 MED ORDER — ACETAMINOPHEN 10 MG/ML IV SOLN: 1000.0000 mg | Freq: Once | INTRAVENOUS | Status: DC | PRN

## 2021-05-02 MED ORDER — PROPOFOL 10 MG/ML IV BOLUS
INTRAVENOUS | Status: DC | PRN
  Administered 2021-05-02: 30 mg via INTRAVENOUS

## 2021-05-02 MED ORDER — DEXAMETHASONE SODIUM PHOSPHATE 10 MG/ML IJ SOLN
10.0000 mg | Freq: Once | INTRAMUSCULAR | Status: AC
  Administered 2021-05-03: 10 mg via INTRAVENOUS
  Filled 2021-05-02: qty 1

## 2021-05-02 MED ORDER — LACTATED RINGERS IV SOLN: INTRAVENOUS | Status: DC

## 2021-05-02 MED ORDER — VANCOMYCIN HCL IN DEXTROSE 1-5 GM/200ML-% IV SOLN
1000.0000 mg | Freq: Two times a day (BID) | INTRAVENOUS | Status: DC
  Filled 2021-05-02: qty 200

## 2021-05-02 MED ORDER — PHENYLEPHRINE HCL-NACL 20-0.9 MG/250ML-% IV SOLN
INTRAVENOUS | Status: DC | PRN
  Administered 2021-05-02: 20 ug/min via INTRAVENOUS

## 2021-05-02 MED ORDER — ONDANSETRON HCL 4 MG/2ML IJ SOLN
INTRAMUSCULAR | Status: DC | PRN
  Administered 2021-05-02: 4 mg via INTRAVENOUS

## 2021-05-02 MED ORDER — 0.9 % SODIUM CHLORIDE (POUR BTL) OPTIME
TOPICAL | Status: DC | PRN
  Administered 2021-05-02: 1000 mL

## 2021-05-02 MED ORDER — BISACODYL 10 MG RE SUPP: 10.0000 mg | Freq: Every day | RECTAL | Status: DC | PRN

## 2021-05-02 MED ORDER — BUPIVACAINE HCL 0.25 % IJ SOLN
INTRAMUSCULAR | Status: DC | PRN
  Administered 2021-05-02: 30 mL

## 2021-05-02 MED ORDER — CEFAZOLIN SODIUM-DEXTROSE 2-4 GM/100ML-% IV SOLN
2.0000 g | Freq: Four times a day (QID) | INTRAVENOUS | Status: AC
  Administered 2021-05-02 (×2): 2 g via INTRAVENOUS
  Filled 2021-05-02 (×2): qty 100

## 2021-05-02 MED ORDER — ACETAMINOPHEN 325 MG PO TABS: 325.0000 mg | ORAL_TABLET | Freq: Four times a day (QID) | ORAL | Status: DC | PRN

## 2021-05-02 MED ORDER — POVIDONE-IODINE 10 % EX SWAB
2.0000 "application " | Freq: Once | CUTANEOUS | Status: AC
  Administered 2021-05-02: 2 via TOPICAL

## 2021-05-02 MED ORDER — ONDANSETRON HCL 4 MG/2ML IJ SOLN: 4.0000 mg | Freq: Four times a day (QID) | INTRAMUSCULAR | Status: DC | PRN

## 2021-05-02 MED ORDER — ONDANSETRON HCL 4 MG/2ML IJ SOLN
INTRAMUSCULAR | Status: AC
  Filled 2021-05-02: qty 2

## 2021-05-02 MED ORDER — METHOCARBAMOL 500 MG PO TABS
500.0000 mg | ORAL_TABLET | Freq: Four times a day (QID) | ORAL | Status: DC | PRN
  Administered 2021-05-02 – 2021-05-03 (×3): 500 mg via ORAL
  Filled 2021-05-02 (×3): qty 1

## 2021-05-02 MED ORDER — LACTATED RINGERS IV SOLN
INTRAVENOUS | Status: DC
Start: 2021-05-02 — End: 2021-05-02

## 2021-05-02 MED ORDER — CEFAZOLIN SODIUM-DEXTROSE 2-4 GM/100ML-% IV SOLN
2.0000 g | INTRAVENOUS | Status: AC
  Administered 2021-05-02: 2 g via INTRAVENOUS
  Filled 2021-05-02: qty 100

## 2021-05-02 MED ORDER — DEXAMETHASONE SODIUM PHOSPHATE 10 MG/ML IJ SOLN
8.0000 mg | Freq: Once | INTRAMUSCULAR | Status: AC
  Administered 2021-05-02: 4 mg via INTRAVENOUS

## 2021-05-02 MED ORDER — TRAMADOL HCL 50 MG PO TABS
50.0000 mg | ORAL_TABLET | Freq: Four times a day (QID) | ORAL | Status: DC | PRN
  Administered 2021-05-02 – 2021-05-03 (×3): 100 mg via ORAL
  Filled 2021-05-02 (×3): qty 2

## 2021-05-02 MED ORDER — FENTANYL CITRATE (PF) 100 MCG/2ML IJ SOLN
INTRAMUSCULAR | Status: DC | PRN
  Administered 2021-05-02 (×2): 50 ug via INTRAVENOUS

## 2021-05-02 MED ORDER — ONDANSETRON HCL 4 MG PO TABS: 4.0000 mg | ORAL_TABLET | Freq: Four times a day (QID) | ORAL | Status: DC | PRN

## 2021-05-02 MED ORDER — HYDROCODONE-ACETAMINOPHEN 5-325 MG PO TABS
1.0000 | ORAL_TABLET | ORAL | Status: DC | PRN
  Administered 2021-05-02: 1 via ORAL
  Administered 2021-05-03 (×2): 2 via ORAL
  Filled 2021-05-02: qty 2
  Filled 2021-05-02: qty 1
  Filled 2021-05-02 (×2): qty 2

## 2021-05-02 MED ORDER — BUPIVACAINE IN DEXTROSE 0.75-8.25 % IT SOLN
INTRATHECAL | Status: DC | PRN
Start: 2021-05-02 — End: 2021-05-02
  Administered 2021-05-02: 1.8 mL via INTRATHECAL

## 2021-05-02 SURGICAL SUPPLY — 43 items
BAG COUNTER SPONGE SURGICOUNT (BAG) ×1 IMPLANT
BAG DECANTER FOR FLEXI CONT (MISCELLANEOUS) IMPLANT
BAG ZIPLOCK 12X15 (MISCELLANEOUS) ×1 IMPLANT
BLADE SAG 18X100X1.27 (BLADE) ×2 IMPLANT
COVER PERINEAL POST (MISCELLANEOUS) ×2 IMPLANT
COVER SURGICAL LIGHT HANDLE (MISCELLANEOUS) ×2 IMPLANT
CUP ACETBLR 52 OD PINNACLE (Hips) ×1 IMPLANT
DRAPE FOOT SWITCH (DRAPES) ×2 IMPLANT
DRAPE STERI IOBAN 125X83 (DRAPES) ×2 IMPLANT
DRAPE U-SHAPE 47X51 STRL (DRAPES) ×4 IMPLANT
DRSG AQUACEL AG ADV 3.5X10 (GAUZE/BANDAGES/DRESSINGS) ×2 IMPLANT
DURAPREP 26ML APPLICATOR (WOUND CARE) ×2 IMPLANT
ELECT REM PT RETURN 15FT ADLT (MISCELLANEOUS) ×2 IMPLANT
GAUZE 4X4 16PLY ~~LOC~~+RFID DBL (SPONGE) IMPLANT
GLOVE SRG 8 PF TXTR STRL LF DI (GLOVE) ×1 IMPLANT
GLOVE SURG ENC MOIS LTX SZ6.5 (GLOVE) ×2 IMPLANT
GLOVE SURG ENC MOIS LTX SZ7 (GLOVE) ×2 IMPLANT
GLOVE SURG ENC MOIS LTX SZ8 (GLOVE) ×4 IMPLANT
GLOVE SURG UNDER POLY LF SZ7 (GLOVE) ×2 IMPLANT
GLOVE SURG UNDER POLY LF SZ8 (GLOVE) ×1
GLOVE SURG UNDER POLY LF SZ8.5 (GLOVE) IMPLANT
GOWN STRL REUS W/TWL LRG LVL3 (GOWN DISPOSABLE) ×4 IMPLANT
GOWN STRL REUS W/TWL XL LVL3 (GOWN DISPOSABLE) IMPLANT
HEAD FEM STD 32X+5 STRL (Hips) ×1 IMPLANT
HOLDER FOLEY CATH W/STRAP (MISCELLANEOUS) ×2 IMPLANT
KIT TURNOVER KIT A (KITS) IMPLANT
LINER MARATHON NEUT +4X52X32 (Hips) ×1 IMPLANT
MANIFOLD NEPTUNE II (INSTRUMENTS) ×2 IMPLANT
PACK ANTERIOR HIP CUSTOM (KITS) ×2 IMPLANT
PENCIL SMOKE EVACUATOR COATED (MISCELLANEOUS) ×2 IMPLANT
SPIKE FLUID TRANSFER (MISCELLANEOUS) ×2 IMPLANT
SPONGE T-LAP 18X18 ~~LOC~~+RFID (SPONGE) ×5 IMPLANT
STEM FEMORAL SZ 5MM STD ACTIS (Stem) ×1 IMPLANT
STRIP CLOSURE SKIN 1/2X4 (GAUZE/BANDAGES/DRESSINGS) ×3 IMPLANT
SUT ETHIBOND NAB CT1 #1 30IN (SUTURE) ×2 IMPLANT
SUT MNCRL AB 4-0 PS2 18 (SUTURE) ×2 IMPLANT
SUT STRATAFIX 0 PDS 27 VIOLET (SUTURE) ×2
SUT VIC AB 2-0 CT1 27 (SUTURE) ×3
SUT VIC AB 2-0 CT1 TAPERPNT 27 (SUTURE) ×2 IMPLANT
SUTURE STRATFX 0 PDS 27 VIOLET (SUTURE) ×1 IMPLANT
SYR 50ML LL SCALE MARK (SYRINGE) IMPLANT
TRAY FOLEY MTR SLVR 16FR STAT (SET/KITS/TRAYS/PACK) ×2 IMPLANT
TUBE SUCTION HIGH CAP CLEAR NV (SUCTIONS) ×2 IMPLANT

## 2021-05-02 NOTE — Anesthesia Procedure Notes (Signed)
Procedure Name: MAC Date/Time: 05/02/2021 9:15 AM Performed by: Eben Burow, CRNA Pre-anesthesia Checklist: Patient identified, Emergency Drugs available, Suction available, Patient being monitored and Timeout performed Oxygen Delivery Method: Simple face mask Placement Confirmation: positive ETCO2

## 2021-05-02 NOTE — Anesthesia Procedure Notes (Signed)
Spinal  Patient location during procedure: OR Start time: 05/02/2021 9:22 AM Reason for block: surgical anesthesia Staffing Performed: resident/CRNA  Anesthesiologist: Darral Dash, DO Resident/CRNA: Eben Burow, CRNA Preanesthetic Checklist Completed: patient identified, IV checked, site marked, risks and benefits discussed, surgical consent, monitors and equipment checked, pre-op evaluation and timeout performed Spinal Block Patient position: sitting Prep: DuraPrep and site prepped and draped Patient monitoring: heart rate, cardiac monitor, continuous pulse ox and blood pressure Approach: midline Location: L3-4 Injection technique: single-shot Needle Needle type: Pencan  Needle gauge: 24 G Needle length: 10 cm Assessment Events: CSF return Additional Notes Pt placed in sitting position, spinal kit expiration date checked and verified, + CSF, - heme, pt tolerated well. Sensory level adequate. Dr Gloris Manchester present and supervising throughout SAB placement.

## 2021-05-02 NOTE — Discharge Instructions (Addendum)
Jesse Arabian, MD Total Joint Specialist [facility] [address], [address] [place], [place] [address] [phone]  ANTERIOR APPROACH TOTAL HIP REPLACEMENT POSTOPERATIVE DIRECTIONS     Hip Rehabilitation, Guidelines Following Surgery  The results of a hip operation are greatly improved after range of motion and muscle strengthening exercises. Follow all safety measures which are given to protect your hip. If any of these exercises cause increased pain or swelling in your joint, decrease the amount until you are comfortable again. Then slowly increase the exercises. Call your caregiver if you have problems or questions.   BLOOD CLOT PREVENTION Take a 325 mg Aspirin two times a day for three weeks following surgery. Then resume one 81 mg aspirin once a day. You may resume your vitamins/supplements upon discharge from the hospital. Do not take any NSAIDs (Advil, Aleve, Ibuprofen, Meloxicam, etc.) until you have discontinued the 325 mg Aspirin.   HOME CARE INSTRUCTIONS  Remove items at home which could result in a fall. This includes throw rugs or furniture in walking pathways.  ICE to the affected hip as frequently as 20-30 minutes an hour and then as needed for pain and swelling. Continue to use ice on the hip for pain and swelling from surgery. You may notice swelling that will progress down to the foot and ankle. This is normal after surgery. Elevate the leg when you are not up walking on it.   Continue to use the breathing machine which will help keep your temperature down.  It is common for your temperature to cycle up and down following surgery, especially at night when you are not up moving around and exerting yourself.  The breathing machine keeps your lungs expanded and your temperature down.  DIET You may resume your previous home diet once your are discharged from the hospital.  DRESSING / WOUND CARE / SHOWERING You have an adhesive waterproof bandage over  the incision. Leave this in place until your first follow-up appointment. Once you remove this you will not need to place another bandage.  You may begin showering 3 days following surgery, but do not submerge the incision under water.  ACTIVITY For the first 3-5 days, it is important to rest and keep the operative leg elevated. You should, as a general rule, rest for 50 minutes and walk/stretch for 10 minutes per hour. After 5 days, you may slowly increase activity as tolerated.  Perform the exercises you were provided twice a day for about 15-20 minutes each session. Begin these 2 days following surgery. Walk with your walker as instructed. Use the walker until you are comfortable transitioning to a cane. Walk with the cane in the opposite hand of the operative leg. You may discontinue the cane once you are comfortable and walking steadily. Avoid periods of inactivity such as sitting longer than an hour when not asleep. This helps prevent blood clots.  Do not drive a car for 6 weeks or until released by your surgeon.  Do not drive while taking narcotics.  TED HOSE STOCKINGS Wear the elastic stockings on both legs for three weeks following surgery during the day. You may remove them at night while sleeping.  WEIGHT BEARING Weight bearing as tolerated with assist device (walker, cane, etc) as directed, use it as long as suggested by your surgeon or therapist, typically at least 4-6 weeks.  POSTOPERATIVE CONSTIPATION PROTOCOL Constipation - defined medically as fewer than three stools per week and severe constipation as less than one stool per  week.  One of the most common issues patients have following surgery is constipation.  Even if you have a regular bowel pattern at home, your normal regimen is likely to be disrupted due to multiple reasons following surgery.  Combination of anesthesia, postoperative narcotics, change in appetite and fluid intake all can affect your bowels.  In order to  avoid complications following surgery, here are some recommendations in order to help you during your recovery period.  Colace (docusate) - Pick up an over-the-counter form of Colace or another stool softener and take twice a day as long as you are requiring postoperative pain medications.  Take with a full glass of water daily.  If you experience loose stools or diarrhea, hold the colace until you stool forms back up.  If your symptoms do not get better within 1 week or if they get worse, check with your doctor. Dulcolax (bisacodyl) - Pick up over-the-counter and take as directed by the product packaging as needed to assist with the movement of your bowels.  Take with a full glass of water.  Use this product as needed if not relieved by Colace only.  MiraLax (polyethylene glycol) - Pick up over-the-counter to have on hand.  MiraLax is a solution that will increase the amount of water in your bowels to assist with bowel movements.  Take as directed and can mix with a glass of water, juice, soda, coffee, or tea.  Take if you go more than two days without a movement.Do not use MiraLax more than once per day. Call your doctor if you are still constipated or irregular after using this medication for 7 days in a row.  If you continue to have problems with postoperative constipation, please contact the office for further assistance and recommendations.  If you experience "the worst abdominal pain ever" or develop nausea or vomiting, please contact the office immediatly for further recommendations for treatment.  ITCHING  If you experience itching with your medications, try taking only a single pain pill, or even half a pain pill at a time.  You can also use Benadryl over the counter for itching or also to help with sleep.   MEDICATIONS See your medication summary on the After Visit Summary that the nursing staff will review with you prior to discharge.  You may have some home medications which will be placed  on hold until you complete the course of blood thinner medication.  It is important for you to complete the blood thinner medication as prescribed by your surgeon.  Continue your approved medications as instructed at time of discharge.  PRECAUTIONS If you experience chest pain or shortness of breath - call 911 immediately for transfer to the hospital emergency department.  If you develop a fever greater that 101 F, purulent drainage from wound, increased redness or drainage from wound, foul odor from the wound/dressing, or calf pain - CONTACT YOUR SURGEON.                                                   FOLLOW-UP APPOINTMENTS Make sure you keep all of your appointments after your operation with your surgeon and caregivers. You should call the office at the above phone number and make an appointment for approximately two weeks after the date of your surgery or on the date instructed by your surgeon  outlined in the "After Visit Summary".  RANGE OF MOTION AND STRENGTHENING EXERCISES  These exercises are designed to help you keep full movement of your hip joint. Follow your caregiver's or physical therapist's instructions. Perform all exercises about fifteen times, three times per day or as directed. Exercise both hips, even if you have had only one joint replacement. These exercises can be done on a training (exercise) mat, on the floor, on a table or on a bed. Use whatever works the best and is most comfortable for you. Use music or television while you are exercising so that the exercises are a pleasant break in your day. This will make your life better with the exercises acting as a break in routine you can look forward to.  Lying on your back, slowly slide your foot toward your buttocks, raising your knee up off the floor. Then slowly slide your foot back down until your leg is straight again.  Lying on your back spread your legs as far apart as you can without causing discomfort.  Lying on your  side, raise your upper leg and foot straight up from the floor as far as is comfortable. Slowly lower the leg and repeat.  Lying on your back, tighten up the muscle in the front of your thigh (quadriceps muscles). You can do this by keeping your leg straight and trying to raise your heel off the floor. This helps strengthen the largest muscle supporting your knee.  Lying on your back, tighten up the muscles of your buttocks both with the legs straight and with the knee bent at a comfortable angle while keeping your heel on the floor.   POST-OPERATIVE OPIOID TAPER INSTRUCTIONS: It is important to wean off of your opioid medication as soon as possible. If you do not need pain medication after your surgery it is ok to stop day one. Opioids include: Codeine, Hydrocodone(Norco, Vicodin), Oxycodone(Percocet, oxycontin) and hydromorphone amongst others.  Long term and even short term use of opiods can cause: Increased pain response Dependence Constipation Depression Respiratory depression And more.  Withdrawal symptoms can include Flu like symptoms Nausea, vomiting And more Techniques to manage these symptoms Hydrate well Eat regular healthy meals Stay active Use relaxation techniques(deep breathing, meditating, yoga) Do Not substitute Alcohol to help with tapering If you have been on opioids for less than two weeks and do not have pain than it is ok to stop all together.  Plan to wean off of opioids This plan should start within one week post op of your joint replacement. Maintain the same interval or time between taking each dose and first decrease the dose.  Cut the total daily intake of opioids by one tablet each day Next start to increase the time between doses. The last dose that should be eliminated is the evening dose.   IF YOU ARE TRANSFERRED TO A SKILLED REHAB FACILITY If the patient is transferred to a skilled rehab facility following release from the hospital, a list of the  current medications will be sent to the facility for the patient to continue.  When discharged from the skilled rehab facility, please have the facility set up the patient's [place] prior to being released. Also, the skilled facility will be responsible for providing the patient with their medications at time of release from the facility to include their pain medication, the muscle relaxants, and their blood thinner medication. If the patient is still at the rehab facility at time of the two  week follow up appointment, the skilled rehab facility will also need to assist the patient in arranging follow up appointment in our office and any transportation needs.  MAKE SURE YOU:  Understand these instructions.  Get help right away if you are not doing well or get worse.    DENTAL ANTIBIOTICS:  In most cases prophylactic antibiotics for Dental procdeures after total joint surgery are not necessary.  Exceptions are as follows:  1. History of prior total joint infection  2. Severely immunocompromised (Organ Transplant, cancer chemotherapy, Rheumatoid biologic meds such as [place])  3. Poorly controlled diabetes (A1C &gt; 8.0, blood glucose over 200)  If you have one of these conditions, contact your surgeon for an antibiotic prescription, prior to your dental procedure.    Pick up stool softner and laxative for home use following surgery while on pain medications. Do not submerge incision under water. Please use good hand washing techniques while changing dressing each day. May shower starting three days after surgery. Please use a clean towel to pat the incision dry following showers. Continue to use ice for pain and swelling after surgery. Do not use any lotions or creams on the incision until instructed by your surgeon.

## 2021-05-02 NOTE — Care Plan (Signed)
Ortho Bundle Case Management Note  Patient Details  Name: Jesse Riddle MRN: [id number] Date of Birth: April 13, 1945                  L THA on 05-02-21 DCP: Home with wife. DME: No needs, has a RW. PT: HEP   DME Arranged:  N/A DME Agency:     HH Arranged:    [place] Agency:     Additional Comments: Please contact me with any questions of if this plan should need to change.  Marianne Sofia, RN,CCM [facility]  [phone] 05/02/2021, 9:52 AM

## 2021-05-02 NOTE — Evaluation (Signed)
Physical Therapy Evaluation Patient Details Name: Jesse Riddle MRN: [id number] DOB: 1946/03/22 Today's Date: 05/02/2021  History of Present Illness  76 y.o. male admitted 05/02/21 for L AA-THA.  Clinical Impression  Pt is s/p THA resulting in the deficits listed below (see PT Problem List). Pt ambulated 45' with RW, no loss of balance. Initiated THA HEP. Good progress expected.  Pt will benefit from skilled PT to increase their independence and safety with mobility to allow discharge to the venue listed below.         Recommendations for follow up therapy are one component of a multi-disciplinary discharge planning process, led by the attending physician.  Recommendations may be updated based on patient status, additional functional criteria and insurance authorization.  Follow Up Recommendations Follow physician's recommendations for discharge plan and follow up therapies    Assistance Recommended at Discharge Intermittent Supervision/Assistance  Patient can return home with the following  Assist for transportation;Help with stairs or ramp for entrance;A little help with bathing/dressing/bathroom    Equipment Recommendations    Recommendations for Other Services       Functional Status Assessment Patient has had a recent decline in their functional status and demonstrates the ability to make significant improvements in function in a reasonable and predictable amount of time.     Precautions / Restrictions Precautions Precautions: Fall Restrictions Weight Bearing Restrictions: No      Mobility  Bed Mobility Overal bed mobility: Needs Assistance Bed Mobility: Supine to Sit     Supine to sit: HOB elevated, Min assist     General bed mobility comments: assist to raise trunk    Transfers Overall transfer level: Needs assistance Equipment used: Rolling walker (2 wheels) Transfers: Sit to/from Stand Sit to Stand: Min guard           General transfer comment: VCs hand  placement, min/guard safety    Ambulation/Gait Ambulation/Gait assistance: Min guard Gait Distance (Feet): 75 Feet Assistive device: Rolling walker (2 wheels) Gait Pattern/deviations: Step-to pattern, Decreased step length - right, Decreased step length - left Gait velocity: decr     General Gait Details: steady with RW, no loss of balance, VCs sequencing with RW  Stairs            Wheelchair Mobility    Modified Rankin (Stroke Patients Only)       Balance Overall balance assessment: Modified Independent                                           Pertinent Vitals/Pain Pain Assessment Pain Assessment: 0-10 Pain Score: 0-No pain Pain Intervention(s): Monitored during session, Ice applied    Home Living Family/patient expects to be discharged to:: Private residence Living Arrangements: Spouse/significant other Available Help at Discharge: Family;Available 24 hours/day                    Prior Function Prior Level of Function : Independent/Modified Independent;Driving             Mobility Comments: walked without AD, rides his bike ADLs Comments: independent     Hand Dominance        Extremity/Trunk Assessment   Upper Extremity Assessment Upper Extremity Assessment: Overall WFL for tasks assessed    Lower Extremity Assessment Lower Extremity Assessment: LLE deficits/detail LLE Deficits / Details: hip flexion AAROM ~40*, hip ABDuction AAROM ~10*, knee ext at  least 3/5, hip +2/5 LLE Sensation: WNL LLE Coordination: WNL    Cervical / Trunk Assessment Cervical / Trunk Assessment: Normal  Communication   Communication: No difficulties  Cognition Arousal/Alertness: Awake/alert Behavior During Therapy: WFL for tasks assessed/performed Overall Cognitive Status: Within Functional Limits for tasks assessed                                          General Comments      Exercises Total Joint  Exercises Ankle Circles/Pumps: AROM, Both, 10 reps, Supine Heel Slides: AAROM, Left, 5 reps, Supine Hip ABduction/ADduction: AAROM, Left, 5 reps, Supine   Assessment/Plan    PT Assessment Patient needs continued PT services  PT Problem List Decreased knowledge of use of DME;Decreased range of motion;Decreased mobility;Decreased strength;Decreased activity tolerance       PT Treatment Interventions Gait training;Therapeutic exercise;Therapeutic activities;Patient/family education;Balance training    PT Goals (Current goals can be found in the Care Plan section)  Acute Rehab PT Goals Patient Stated Goal: to bike PT Goal Formulation: With patient/family Time For Goal Achievement: 05/09/21 Potential to Achieve Goals: Good    Frequency 7X/week     Co-evaluation               AM-PAC PT "6 Clicks" Mobility  Outcome Measure Help needed turning from your back to your side while in a flat bed without using bedrails?: A Little Help needed moving from lying on your back to sitting on the side of a flat bed without using bedrails?: A Little Help needed moving to and from a bed to a chair (including a wheelchair)?: A Little Help needed standing up from a chair using your arms (e.g., wheelchair or bedside chair)?: A Little Help needed to walk in hospital room?: A Little Help needed climbing 3-5 steps with a railing? : A Lot 6 Click Score: 17    End of Session Equipment Utilized During Treatment: Gait belt Activity Tolerance: Patient tolerated treatment well Patient left: in chair;with call bell/phone within reach;with family/visitor present Nurse Communication: Mobility status PT Visit Diagnosis: Difficulty in walking, not elsewhere classified (R26.2)    Time: 0017-4944 PT Time Calculation (min) (ACUTE ONLY): 31 min   Charges:   PT Evaluation $PT Eval Moderate Complexity: 1 Mod PT Treatments $Gait Training: 8-22 mins      [name] PT 05/02/2021  Acute  Rehabilitation Services Pager [phone] Office [phone]

## 2021-05-02 NOTE — Anesthesia Postprocedure Evaluation (Signed)
Anesthesia Post Note  Patient: Jesse Riddle  Procedure(s) Performed: TOTAL HIP ARTHROPLASTY ANTERIOR APPROACH (Left: Hip)     Patient location during evaluation: PACU Anesthesia Type: MAC and Spinal Level of consciousness: awake and alert Pain management: pain level controlled Vital Signs Assessment: post-procedure vital signs reviewed and stable Respiratory status: spontaneous breathing, nonlabored ventilation, respiratory function stable and patient connected to nasal cannula oxygen Cardiovascular status: stable and blood pressure returned to baseline Postop Assessment: no apparent nausea or vomiting Anesthetic complications: no   No notable events documented.  Last Vitals:  Vitals:   05/02/21 1250 05/02/21 1457  BP: (!) 142/76 134/73  Pulse: 60 94  Resp: 18 18  Temp: 36.8 C 36.6 C  SpO2: 99% 96%    Last Pain:  Vitals:   05/02/21 1457  TempSrc: Oral  PainSc:                  Jesse Riddle

## 2021-05-02 NOTE — Transfer of Care (Signed)
Immediate Anesthesia Transfer of Care Note  Patient: Jesse Riddle  Procedure(s) Performed: TOTAL HIP ARTHROPLASTY ANTERIOR APPROACH (Left: Hip)  Patient Location: PACU  Anesthesia Type:Spinal  Level of Consciousness: awake and drowsy  Airway & Oxygen Therapy: Patient Spontanous Breathing and Patient connected to face mask oxygen  Post-op Assessment: Report given to RN and Post -op Vital signs reviewed and stable  Post vital signs: Reviewed and stable  Last Vitals:  Vitals Value Taken Time  BP 108/70 05/02/21 1056  Temp    Pulse 66 05/02/21 1057  Resp 14 05/02/21 1057  SpO2 97 % 05/02/21 1057  Vitals shown include unvalidated device data.  Last Pain:  Vitals:   05/02/21 0824  TempSrc:   PainSc: 0-No pain      Patients Stated Pain Goal: 7 (99/37/16 9678)  Complications: No notable events documented.

## 2021-05-02 NOTE — Plan of Care (Signed)
°  Problem: Activity: Goal: Ability to avoid complications of mobility impairment will improve Outcome: Progressing Goal: Ability to tolerate increased activity will improve Outcome: Progressing   Problem: Pain Management: Goal: Pain level will decrease with appropriate interventions Outcome: Progressing   Problem: Skin Integrity: Goal: Will show signs of wound healing Outcome: Progressing   

## 2021-05-02 NOTE — Interval H&P Note (Signed)
History and Physical Interval Note:  05/02/2021 7:55 AM  Marge Duncans  has presented today for surgery, with the diagnosis of left hip osteoarthritis.  The various methods of treatment have been discussed with the patient and family. After consideration of risks, benefits and other options for treatment, the patient has consented to  Procedure(s): TOTAL HIP ARTHROPLASTY ANTERIOR APPROACH (Left) as a surgical intervention.  The patient's history has been reviewed, patient examined, no change in status, stable for surgery.  I have reviewed the patient's chart and labs.  Questions were answered to the patient's satisfaction.     Pilar Plate Bazil Dhanani

## 2021-05-02 NOTE — Op Note (Signed)
OPERATIVE REPORT- TOTAL HIP ARTHROPLASTY   PREOPERATIVE DIAGNOSIS: Osteoarthritis of the Left hip.   POSTOPERATIVE DIAGNOSIS: Osteoarthritis of the Left  hip.   PROCEDURE: Left total hip arthroplasty, anterior approach.   SURGEON: Gaynelle Arabian, MD   ASSISTANT: Fenton Foy, PA-C  ANESTHESIA:  Spinal  ESTIMATED BLOOD LOSS:-200 mL    DRAINS: Hemovac x1.   COMPLICATIONS: None   CONDITION: PACU - hemodynamically stable.   BRIEF CLINICAL NOTE: Jesse Riddle is a 76 y.o. male who has advanced end-  stage arthritis of their Left  hip with progressively worsening pain and  dysfunction.The patient has failed nonoperative management and presents for  total hip arthroplasty.   PROCEDURE IN DETAIL: After successful administration of spinal  anesthetic, the traction boots for the [facility] bed were placed on both  feet and the patient was placed onto the [facility] bed, boots placed into the leg  holders. The Left hip was then isolated from the perineum with plastic  drapes and prepped and draped in the usual sterile fashion. ASIS and  greater trochanter were marked and a oblique incision was made, starting  at about 1 cm lateral and 2 cm distal to the ASIS and coursing towards  the anterior cortex of the femur. The skin was cut with a 10 blade  through subcutaneous tissue to the level of the fascia overlying the  tensor fascia lata muscle. The fascia was then incised in line with the  incision at the junction of the anterior third and posterior 2/3rd. The  muscle was teased off the fascia and then the interval between the TFL  and the rectus was developed. The Hohmann retractor was then placed at  the top of the femoral neck over the capsule. The vessels overlying the  capsule were cauterized and the fat on top of the capsule was removed.  A Hohmann retractor was then placed anterior underneath the rectus  femoris to give exposure to the entire anterior capsule. A T-shaped   capsulotomy was performed. The edges were tagged and the femoral head  was identified.       Osteophytes are removed off the superior acetabulum.  The femoral neck was then cut in situ with an oscillating saw. Traction  was then applied to the left lower extremity utilizing the [facility]  traction. The femoral head was then removed. Retractors were placed  around the acetabulum and then circumferential removal of the labrum was  performed. Osteophytes were also removed. Reaming starts at 49 mm to  medialize and  Increased in 2 mm increments to 51 mm. We reamed in  approximately 40 degrees of abduction, 20 degrees anteversion. A 52 mm  pinnacle acetabular shell was then impacted in anatomic position under  fluoroscopic guidance with excellent purchase. We did not need to place  any additional dome screws. A 32 mm neutral + 4 marathon liner was then  placed into the acetabular shell.       The femoral lift was then placed along the lateral aspect of the femur  just distal to the vastus ridge. The leg was  externally rotated and capsule  was stripped off the inferior aspect of the femoral neck down to the  level of the lesser trochanter, this was done with electrocautery. The femur was lifted after this was performed. The  leg was then placed in an extended and adducted position essentially delivering the femur. We also removed the capsule superiorly and the piriformis from the piriformis  fossa to gain excellent exposure of the  proximal femur. Rongeur was used to remove some cancellous bone to get  into the lateral portion of the proximal femur for placement of the  initial starter reamer. The starter broaches was placed  the starter broach  and was shown to go down the center of the canal. Broaching  with the Actis system was then performed starting at size 0  coursing  Up to size 5. A size 5 had excellent torsional and rotational  and axial stability. The trial high offset neck was then placed   with a 32 + 5 trial head. The hip was then reduced. We confirmed that  the stem was in the canal both on AP and lateral x-rays. It also has excellent sizing. The hip was reduced with outstanding stability through full extension and full external rotation.. AP pelvis was taken and the leg lengths were measured and found to be equal. Hip was then dislocated again and the femoral head and neck removed. The  femoral broach was removed. Size 5 Actis stem with a high offset  neck was then impacted into the femur following native anteversion. Has  excellent purchase in the canal. Excellent torsional and rotational and  axial stability. It is confirmed to be in the canal on AP and lateral  fluoroscopic views. The 32 + 5 metal head was placed and the hip  reduced with outstanding stability. Again AP pelvis was taken and it  confirmed that the leg lengths were equal. The wound was then copiously  irrigated with saline solution and the capsule reattached and repaired  with Ethibond suture. 30 ml of .25% Bupivicaine was  injected into the capsule and into the edge of the tensor fascia lata as well as subcutaneous tissue. The fascia overlying the tensor fascia lata was then closed with a running #1 V-Loc. Subcu was closed with interrupted 2-0 Vicryl and subcuticular running 4-0 Monocryl. Incision was cleaned  and dried. Steri-Strips and a bulky sterile dressing applied. The patient was awakened and transported to  recovery in stable condition.        Please note that a surgical assistant was a medical necessity for this procedure to perform it in a safe and expeditious manner. Assistant was necessary to provide appropriate retraction of vital neurovascular structures and to prevent femoral fracture and allow for anatomic placement of the prosthesis.  Gaynelle Arabian, M.D.

## 2021-05-03 ENCOUNTER — Encounter (HOSPITAL_COMMUNITY): Payer: Self-pay | Admitting: Orthopedic Surgery

## 2021-05-03 DIAGNOSIS — M1612 Unilateral primary osteoarthritis, left hip: Secondary | ICD-10-CM | POA: Diagnosis not present

## 2021-05-03 LAB — CBC
HCT: 39.6 % (ref 39.0–52.0)
Hemoglobin: 12.8 g/dL — ABNORMAL LOW (ref 13.0–17.0)
MCH: 28.6 pg (ref 26.0–34.0)
MCHC: 32.3 g/dL (ref 30.0–36.0)
MCV: 88.4 fL (ref 80.0–100.0)
Platelets: 238 10*3/uL (ref 150–400)
RBC: 4.48 MIL/uL (ref 4.22–5.81)
RDW: 15.2 % (ref 11.5–15.5)
WBC: 16.8 10*3/uL — ABNORMAL HIGH (ref 4.0–10.5)
nRBC: 0 % (ref 0.0–0.2)

## 2021-05-03 LAB — BASIC METABOLIC PANEL
Anion gap: 7 (ref 5–15)
BUN: 14 mg/dL (ref 8–23)
CO2: 24 mmol/L (ref 22–32)
Calcium: 7.9 mg/dL — ABNORMAL LOW (ref 8.9–10.3)
Chloride: 106 mmol/L (ref 98–111)
Creatinine, Ser: 1.03 mg/dL (ref 0.61–1.24)
GFR, Estimated: >60 mL/min (ref >60)
Glucose, Bld: 127 mg/dL — ABNORMAL HIGH (ref 70–99)
Potassium: 3.9 mmol/L (ref 3.5–5.1)
Sodium: 137 mmol/L (ref 135–145)

## 2021-05-03 MED ORDER — METHOCARBAMOL 500 MG PO TABS: 500.0000 mg | ORAL_TABLET | Freq: Four times a day (QID) | ORAL | 0 refills | Status: DC | PRN

## 2021-05-03 MED ORDER — ASPIRIN 325 MG PO TBEC: 325.0000 mg | DELAYED_RELEASE_TABLET | Freq: Two times a day (BID) | ORAL | 0 refills | Status: AC

## 2021-05-03 MED ORDER — HYDROCODONE-ACETAMINOPHEN 5-325 MG PO TABS: 1.0000 | ORAL_TABLET | Freq: Four times a day (QID) | ORAL | 0 refills | Status: DC | PRN

## 2021-05-03 MED ORDER — TRAMADOL HCL 50 MG PO TABS: 50.0000 mg | ORAL_TABLET | Freq: Four times a day (QID) | ORAL | 0 refills | Status: DC | PRN

## 2021-05-03 NOTE — Progress Notes (Signed)
Patient discharged to home w/ family. Given all belongings, instructions. Verbalized understanding of all instructions. Escorted to pov via w/c. 

## 2021-05-03 NOTE — TOC Transition Note (Signed)
Transition of Care [facility]) - CM/SW Discharge Note   Patient Details  Name: Jesse Riddle MRN: [id number] Date of Birth: 1945/11/25  [facility] of [facility]) CM/SW Contact:  Lennart Pall, LCSW Phone Number: 05/03/2021, 9:52 AM   Clinical Narrative:    Met with pt and wife and confirming he has all needed DME at home.  Plan HEP. No TOC needs.   Final next level of care: Home/Self Care Barriers to Discharge: No Barriers Identified   Patient Goals and CMS Choice Patient states their goals for this hospitalization and ongoing recovery are:: return home      Discharge Placement                       Discharge Plan and Services                DME Arranged: N/A DME Agency: NA                  Social Determinants of Health (SDOH) Interventions     Readmission Risk Interventions No flowsheet data found.

## 2021-05-03 NOTE — Progress Notes (Signed)
° °  Subjective: 1 Day Post-Op Procedure(s) (LRB): TOTAL HIP ARTHROPLASTY ANTERIOR APPROACH (Left) Patient reports pain as mild.   Patient seen in rounds with Dr. Wynelle Link. Patient is well, and has had no acute complaints or problems. Denies chest pain or SOB. No issues overnight. Foley catheter removed this AM. We will continue therapy today, ambulated 39' yesterday.   Objective: Vital signs in last 24 hours: Temp:  [97.5 F (36.4 C)-98.9 F (37.2 C)] 98.9 F (37.2 C) (02/02 0537) Pulse Rate:  [54-110] 72 (02/02 0537) Resp:  [13-22] 18 (02/02 0537) BP: (108-142)/(64-95) 129/68 (02/02 0537) SpO2:  [96 %-99 %] 97 % (02/02 0537) Weight:  [74.7 kg] 74.7 kg (02/01 0824)  Intake/Output from previous day:  Intake/Output Summary (Last 24 hours) at 05/03/2021 0737 Last data filed at 05/03/2021 0600 Gross per 24 hour  Intake 1460 ml  Output 2675 ml  Net -1215 ml     Intake/Output this shift: No intake/output data recorded.  Labs: Recent Labs    05/03/21 0311  HGB 12.8*   Recent Labs    05/03/21 0311  WBC 16.8*  RBC 4.48  HCT 39.6  PLT 238   Recent Labs    05/03/21 0311  NA 137  K 3.9  CL 106  CO2 24  BUN 14  CREATININE 1.03  GLUCOSE 127*  CALCIUM 7.9*   No results for input(s): LABPT, INR in the last 72 hours.  Exam: General - Patient is Alert and Oriented Extremity - Neurologically intact Neurovascular intact Sensation intact distally Dorsiflexion/Plantar flexion intact Dressing - dressing C/D/I Motor Function - intact, moving foot and toes well on exam.   Past Medical History:  Diagnosis Date   Allergy    penicillin   History of kidney stones     Assessment/Plan: 1 Day Post-Op Procedure(s) (LRB): TOTAL HIP ARTHROPLASTY ANTERIOR APPROACH (Left) Principal Problem:   OA (osteoarthritis) of hip Active Problems:   S/P total left hip arthroplasty  Estimated body mass index is 23.62 kg/m as calculated from the following:   Height as of this  encounter: 5\' 10"  (1.778 m).   Weight as of this encounter: 74.7 kg. Advance diet Up with therapy D/C IV fluids  DVT Prophylaxis - Aspirin Weight bearing as tolerated. Continue therapy.  Plan is to go Home after hospital stay. Plan for discharge with HEP later today if progresses with therapy and meeting goals. Follow-up in the office in 2 weeks.  The PDMP database was reviewed today prior to any opioid medications being prescribed to this patient.  Theresa Duty, PA-C Orthopedic Surgery [phone] 05/03/2021, 7:37 AM

## 2021-05-03 NOTE — Plan of Care (Signed)
Plan of care reviewed and discussed with the patient and his wife. 

## 2021-05-03 NOTE — Progress Notes (Signed)
Physical Therapy Treatment Patient Details Name: Jesse Riddle MRN: [id number] DOB: [birth year] Today's Date: 05/03/2021   History of Present Illness 76 y.o. male admitted 05/02/21 for L AA-THA.    PT Comments    Pt is mobilizing well and is ready to DC home from a PT standpoint. He ambulated 300', completed stair training, and demonstrates good understanding of HEP.    Recommendations for follow up therapy are one component of a multi-disciplinary discharge planning process, led by the attending physician.  Recommendations may be updated based on patient status, additional functional criteria and insurance authorization.  Follow Up Recommendations  Follow physician's recommendations for discharge plan and follow up therapies     Assistance Recommended at Discharge Intermittent Supervision/Assistance  Patient can return home with the following Assist for transportation;Help with stairs or ramp for entrance;A little help with bathing/dressing/bathroom   Equipment Recommendations  Rolling walker (2 wheels)    Recommendations for Other Services       Precautions / Restrictions Precautions Precautions: Fall Restrictions Weight Bearing Restrictions: No Other Position/Activity Restrictions: WBAT     Mobility  Bed Mobility Overal bed mobility: Modified Independent Bed Mobility: Supine to Sit     Supine to sit: HOB elevated, Modified independent (Device/Increase time)     General bed mobility comments: no assist needed, used bedrail    Transfers Overall transfer level: Needs assistance Equipment used: Rolling walker (2 wheels) Transfers: Sit to/from Stand Sit to Stand: Supervision           General transfer comment: VCs hand placement    Ambulation/Gait Ambulation/Gait assistance: Supervision Gait Distance (Feet): 300 Feet Assistive device: Rolling walker (2 wheels) Gait Pattern/deviations: Decreased step length - right, Decreased step length - left, Step-through  pattern Gait velocity: decr     General Gait Details: steady with RW, no loss of balance, good sequencing   Stairs Stairs: Yes Stairs assistance: Supervision Stair Management: One rail Right, Forwards, Step to pattern, With cane Number of Stairs: 3 General stair comments: VCs sequencing   Wheelchair Mobility    Modified Rankin (Stroke Patients Only)       Balance Overall balance assessment: Modified Independent                                          Cognition Arousal/Alertness: Awake/alert Behavior During Therapy: WFL for tasks assessed/performed Overall Cognitive Status: Within Functional Limits for tasks assessed                                          Exercises Total Joint Exercises Ankle Circles/Pumps: AROM, Both, 10 reps, Supine Quad Sets: AROM, Left, 5 reps, Supine Short Arc Quad: AROM, Left, 5 reps, Supine Heel Slides: AAROM, Left, 5 reps, Supine Hip ABduction/ADduction: AAROM, Left, 5 reps, Supine Long Arc Quad: AROM, Left, 5 reps, Seated    General Comments        Pertinent Vitals/Pain Pain Assessment Pain Score: 3  Pain Location: L hip Pain Descriptors / Indicators: Sore Pain Intervention(s): Limited activity within patient's tolerance, Monitored during session, Premedicated before session, Ice applied    Home Living                          Prior Function  PT Goals (current goals can now be found in the care plan section) Acute Rehab PT Goals Patient Stated Goal: to bike PT Goal Formulation: With patient/family Time For Goal Achievement: 05/09/21 Potential to Achieve Goals: Good Progress towards PT goals: Progressing toward goals    Frequency    7X/week      PT Plan Current plan remains appropriate    Co-evaluation              AM-PAC PT "6 Clicks" Mobility   Outcome Measure  Help needed turning from your back to your side while in a flat bed without using  bedrails?: A Little Help needed moving from lying on your back to sitting on the side of a flat bed without using bedrails?: A Little Help needed moving to and from a bed to a chair (including a wheelchair)?: None Help needed standing up from a chair using your arms (e.g., wheelchair or bedside chair)?: None Help needed to walk in hospital room?: None Help needed climbing 3-5 steps with a railing? : A Little 6 Click Score: 21    End of Session Equipment Utilized During Treatment: Gait belt Activity Tolerance: Patient tolerated treatment well Patient left: in chair;with call bell/phone within reach;with family/visitor present Nurse Communication: Mobility status PT Visit Diagnosis: Difficulty in walking, not elsewhere classified (R26.2)     Time: 5248-1859 PT Time Calculation (min) (ACUTE ONLY): 30 min  Charges:  $Gait Training: 8-22 mins $Therapeutic Exercise: 8-22 mins                    Jesse Riddle PT 05/03/2021  Acute Rehabilitation Services Pager [phone] Office [phone]

## 2021-05-07 NOTE — Discharge Summary (Signed)
Patient ID: Jesse Riddle MRN: [id number] DOB/AGE: [birth year] 76 y.o.  Admit date: 05/02/2021 Discharge date: 05/03/2021  Admission Diagnoses:  Principal Problem:   OA (osteoarthritis) of hip Active Problems:   S/P total left hip arthroplasty   Discharge Diagnoses:  Same  Past Medical History:  Diagnosis Date   Allergy    penicillin   History of kidney stones     Surgeries: Procedure(s): TOTAL HIP ARTHROPLASTY ANTERIOR APPROACH on 05/02/2021   Consultants:   Discharged Condition: Improved  Hospital Course: SUHAS ESTIS is an 76 y.o. male who was admitted 05/02/2021 for operative treatment ofOA (osteoarthritis) of hip. Patient has severe unremitting pain that affects sleep, daily activities, and work/hobbies. After pre-op clearance the patient was taken to the operating room on 05/02/2021 and underwent  Procedure(s): TOTAL HIP ARTHROPLASTY ANTERIOR APPROACH.    Patient was given perioperative antibiotics:  Anti-infectives (From admission, onward)    Start     Dose/Rate Route Frequency Ordered Stop   05/02/21 1530  vancomycin (VANCOCIN) IVPB 1000 mg/200 mL premix  Status:  Discontinued        1,000 mg 200 mL/hr over 60 Minutes Intravenous Every 12 hours 05/02/21 1255 05/02/21 1403   05/02/21 1530  ceFAZolin (ANCEF) IVPB 2g/100 mL premix        2 g 200 mL/hr over 30 Minutes Intravenous Every 6 hours 05/02/21 1404 05/02/21 2335   05/02/21 0800  ceFAZolin (ANCEF) IVPB 2g/100 mL premix        2 g 200 mL/hr over 30 Minutes Intravenous On call to O.R. 05/02/21 1884 05/02/21 0934        Patient was given sequential compression devices, early ambulation, and chemoprophylaxis to prevent DVT.  Patient benefited maximally from hospital stay and there were no complications.    Recent vital signs: No data found.   Recent laboratory studies: No results for input(s): WBC, HGB, HCT, PLT, NA, K, CL, CO2, BUN, CREATININE, GLUCOSE, INR, CALCIUM in the last 72 hours.  Invalid input(s):  PT, 2   Discharge Medications:   Allergies as of 05/03/2021       Reactions   Penicillins Other (See Comments)   Child Tolerated Cephalosporin Date: 05/03/21.        Medication List     STOP taking these medications    aspirin 81 MG tablet Replaced by: aspirin 325 MG EC tablet       TAKE these medications    aspirin 325 MG EC tablet Take 1 tablet (325 mg total) by mouth 2 (two) times daily for 20 days. Then resume one 81 mg aspirin once a day. Replaces: aspirin 81 MG tablet   atorvastatin 10 MG tablet Commonly known as: LIPITOR Take 1 tablet by mouth once daily at 6pm   CO Q-10 PO Take 1 capsule by mouth daily.   fish oil-omega-3 fatty acids 1000 MG capsule Take 1 g by mouth daily.   HYDROcodone-acetaminophen 5-325 MG tablet Commonly known as: NORCO/VICODIN Take 1-2 tablets by mouth every 6 (six) hours as needed for severe pain.   methocarbamol 500 MG tablet Commonly known as: ROBAXIN Take 1 tablet (500 mg total) by mouth every 6 (six) hours as needed for muscle spasms.   QC TUMERIC COMPLEX PO Take 1 capsule by mouth daily.   traMADol 50 MG tablet Commonly known as: ULTRAM Take 1-2 tablets (50-100 mg total) by mouth every 6 (six) hours as needed for moderate pain.  Discharge Care Instructions  (From admission, onward)           Start     Ordered   05/03/21 0000  Weight bearing as tolerated        05/03/21 0740   05/03/21 0000  Change dressing       Comments: You have an adhesive waterproof bandage over the incision. Leave this in place until your first follow-up appointment. Once you remove this you will not need to place another bandage.   05/03/21 0740            Diagnostic Studies: DG Chest 2 View  Result Date: 04/20/2021 CLINICAL DATA:  Preop left hip replacement 05/02/2021. EXAM: CHEST - 2 VIEW COMPARISON:  None. FINDINGS: The cardiomediastinal contours are normal. Minor subsegmental linear atelectasis or scar in  the right middle lobe. Pulmonary vasculature is normal. No consolidation, pleural effusion, or pneumothorax. No acute osseous abnormalities are seen. IMPRESSION: Minor subsegmental linear atelectasis or scar in the right middle lobe. Otherwise negative. Electronically Signed   By: Keith Rake M.D.   On: 04/20/2021 12:32   DG Pelvis Portable  Result Date: 05/02/2021 CLINICAL DATA:  Left hip arthroplasty EXAM: PORTABLE PELVIS 1-2 VIEWS COMPARISON:  None. FINDINGS: There is evidence of recent left hip arthroplasty. No fracture is seen. There are pockets of air in the soft tissues from recent surgery. Arterial calcifications are seen in the soft tissues. Surgical clips are seen inferior to pubic bones on both sides IMPRESSION: Recent left hip arthroplasty. Electronically Signed   By: Elmer Picker M.D.   On: 05/02/2021 11:36   DG C-Arm 1-60 Min-No Report  Result Date: 05/02/2021 Fluoroscopy was utilized by the requesting physician.  No radiographic interpretation.   DG HIP OPERATIVE UNILAT W OR W/O PELVIS LEFT  Result Date: 05/02/2021 CLINICAL DATA:  Hip replacement EXAM: OPERATIVE left HIP (WITH PELVIS IF PERFORMED) 14 VIEWS TECHNIQUE: Fluoroscopic spot image(s) were submitted for interpretation post-operatively. COMPARISON:  None. FINDINGS: Intraoperative images during left hip arthroplasty. Normal alignment without evidence of immediate hardware complication. IMPRESSION: Intraoperative images during left hip arthroplasty. Normal alignment without evidence of immediate hardware complication. Electronically Signed   By: Maurine Simmering M.D.   On: 05/02/2021 10:35    Disposition: Discharge disposition: 01-Home or Self Care       Discharge Instructions     Call MD / Call 911   Complete by: As directed    If you experience chest pain or shortness of breath, CALL 911 and be transported to the hospital emergency room.  If you develope a fever above 101 F, pus (white drainage) or increased  drainage or redness at the wound, or calf pain, call your surgeon's office.   Change dressing   Complete by: As directed    You have an adhesive waterproof bandage over the incision. Leave this in place until your first follow-up appointment. Once you remove this you will not need to place another bandage.   Constipation Prevention   Complete by: As directed    Drink plenty of fluids.  Prune juice may be helpful.  You may use a stool softener, such as Colace (over the counter) 100 mg twice a day.  Use MiraLax (over the counter) for constipation as needed.   Diet - low sodium heart healthy   Complete by: As directed    Do not sit on low chairs, stoools or toilet seats, as it may be difficult to get up from low surfaces   Complete  by: As directed    Driving restrictions   Complete by: As directed    No driving for two weeks   Post-operative opioid taper instructions:   Complete by: As directed    POST-OPERATIVE OPIOID TAPER INSTRUCTIONS: It is important to wean off of your opioid medication as soon as possible. If you do not need pain medication after your surgery it is ok to stop day one. Opioids include: Codeine, Hydrocodone(Norco, Vicodin), Oxycodone(Percocet, oxycontin) and hydromorphone amongst others.  Long term and even short term use of opiods can cause: Increased pain response Dependence Constipation Depression Respiratory depression And more.  Withdrawal symptoms can include Flu like symptoms Nausea, vomiting And more Techniques to manage these symptoms Hydrate well Eat regular healthy meals Stay active Use relaxation techniques(deep breathing, meditating, yoga) Do Not substitute Alcohol to help with tapering If you have been on opioids for less than two weeks and do not have pain than it is ok to stop all together.  Plan to wean off of opioids This plan should start within one week post op of your joint replacement. Maintain the same interval or time between taking  each dose and first decrease the dose.  Cut the total daily intake of opioids by one tablet each day Next start to increase the time between doses. The last dose that should be eliminated is the evening dose.      TED hose   Complete by: As directed    Use stockings (TED hose) for three weeks on both leg(s).  You may remove them at night for sleeping.   Weight bearing as tolerated   Complete by: As directed         Follow-up Information     Gaynelle Arabian, MD. Go on 05/15/2021.   Specialty: Orthopedic Surgery Why: You are scheduled for first post op appointment on Tuesday February 14th at 2:45pm. Contact information: [address] [place] [address] [phone]                  Signed: Theresa Duty 05/07/2021, 7:43 AM

## 2021-06-07 DIAGNOSIS — Z4789 Encounter for other orthopedic aftercare: Secondary | ICD-10-CM | POA: Diagnosis not present

## 2021-06-07 DIAGNOSIS — Z471 Aftercare following joint replacement surgery: Secondary | ICD-10-CM | POA: Diagnosis not present

## 2021-07-20 DIAGNOSIS — H26491 Other secondary cataract, right eye: Secondary | ICD-10-CM | POA: Diagnosis not present

## 2021-07-20 DIAGNOSIS — H2512 Age-related nuclear cataract, left eye: Secondary | ICD-10-CM | POA: Diagnosis not present

## 2021-07-20 DIAGNOSIS — H43813 Vitreous degeneration, bilateral: Secondary | ICD-10-CM | POA: Diagnosis not present

## 2021-07-20 DIAGNOSIS — H5202 Hypermetropia, left eye: Secondary | ICD-10-CM | POA: Diagnosis not present

## 2021-10-07 NOTE — Progress Notes (Unsigned)
Subjective:  Patient ID: Jesse Riddle, male    DOB: Dec 17, 1945  Age: 76 y.o. MRN: [id number]  CC: No chief complaint on file.   HPI [name] presents for Annual Exam  Status post total left hip arthroplasty in February.  Dr. Wynelle Link.  I saw him in January for preoperative evaluation.  Labs performed at that time.  Prediabetes: Slight elevated A1c in January but glucose normal in office at that time 87. Lab Results  Component Value Date   HGBA1C 6.0 04/16/2021   Wt Readings from Last 3 Encounters:  05/02/21 164 lb 9.6 oz (74.7 kg)  04/25/21 164 lb 9.6 oz (74.7 kg)  04/16/21 169 lb 3.2 oz (76.7 kg)   Hyperlipidemia: Treated with atorvastatin 10 mg daily, also takes aspirin 81 mg daily.  We discussed risk versus benefits of aspirin previously.  He did have a coronary calcium score in June 2020 of 196 with moderate calcified coronary plaque.  Option of higher dose statin has been discussed previously. Lab Results  Component Value Date   CHOL 143 04/16/2021   HDL 50.50 04/16/2021   LDLCALC 82 04/16/2021   TRIG 56.0 04/16/2021   CHOLHDL 3 04/16/2021   Lab Results  Component Value Date   ALT 17 04/16/2021   AST 26 04/16/2021   ALKPHOS 67 04/16/2021   BILITOT 0.6 04/16/2021         04/16/2021    8:09 AM 09/28/2020    2:33 PM 05/12/2019    9:58 AM 04/13/2018    1:41 PM 04/13/2018    1:12 PM  Depression screen PHQ 2/9  Decreased Interest 0 0 0 0 0  Down, Depressed, Hopeless 0 0 0 0 0  PHQ - 2 Score 0 0 0 0 0  Altered sleeping 0      Tired, decreased energy 0      Change in appetite 0      Feeling bad or failure about yourself  0      Trouble concentrating 0      Moving slowly or fidgety/restless 0      Suicidal thoughts 0      PHQ-9 Score 0        Health Maintenance  Topic Date Due   Zoster Vaccines- Shingrix (1 of 2) Never done   COVID-19 Vaccine (3 - Pfizer series) 07/09/2019   INFLUENZA VACCINE  10/30/2021   TETANUS/TDAP  11/21/2021   Pneumonia  Vaccine 98+ Years old  Completed   Hepatitis C Screening  Completed   HPV VACCINES  Aged Out   COLONOSCOPY (Pts 45-76yr Insurance coverage will need to be confirmed)  Discontinued  Colon cancer screening, colonoscopy 2015, plan to repeat last year. Prostate: PSA is in the normal range through 2021, declined screening last year.  The natural history of prostate cancer and ongoing controversy regarding screening and potential treatment outcomes of prostate cancer has been discussed with the patient. The meaning of a false positive PSA and a false negative PSA has been discussed. He indicates understanding of the limitations of this screening test and wishes *** to proceed with screening PSA testing.  Lab Results  Component Value Date   PSA1 2.6 05/12/2019   PSA1 3.7 04/13/2018   PSA1 2.9 05/03/2016   PSA 3.16 03/13/2015   PSA 2.49 12/27/2013   PSA 2.51 12/21/2012    Immunization History  Administered Date(s) Administered   Influenza,inj,Quad PF,6+ Mos 12/27/2013, 11/25/2016   Influenza-Unspecified 12/31/2014, 01/08/2016, 12/21/2020   PFIZER(Purple Top)SARS-COV-2  Vaccination 04/21/2019, 05/14/2019   Pneumococcal Conjugate-13 12/27/2013   Pneumococcal Polysaccharide-23 11/22/2011   Tdap 11/22/2011   Zoster, Live 02/27/2013    No results found. He is followed by ophthalmology with history of cataracts.  Dental: Followed by dentist every 6 months.  Alcohol:  Tobacco: None  Exercise:{types:19826}   History Patient Active Problem List   Diagnosis Date Noted   OA (osteoarthritis) of hip 05/02/2021   S/P total left hip arthroplasty 05/02/2021   Past Medical History:  Diagnosis Date   Allergy    penicillin   History of kidney stones    Past Surgical History:  Procedure Laterality Date   COLONOSCOPY     FINGER SURGERY     TONSILLECTOMY     TOTAL HIP ARTHROPLASTY Left 05/02/2021   Procedure: TOTAL HIP ARTHROPLASTY ANTERIOR APPROACH;  Surgeon: Gaynelle Arabian, MD;  Location:  [facility];  Service: Orthopedics;  Laterality: Left;   Allergies  Allergen Reactions   Penicillins Other (See Comments)    Child Tolerated Cephalosporin Date: 05/03/21.     Prior to Admission medications   Medication Sig Start Date End Date Taking? Authorizing Provider  atorvastatin (LIPITOR) 10 MG tablet Take 1 tablet by mouth once daily at 6pm 04/16/21   Wendie Agreste, MD  Coenzyme Q10 (CO Q-10 PO) Take 1 capsule by mouth daily.    [provider]  fish oil-omega-3 fatty acids 1000 MG capsule Take 1 g by mouth daily.    [provider]  HYDROcodone-acetaminophen (NORCO/VICODIN) 5-325 MG tablet Take 1-2 tablets by mouth every 6 (six) hours as needed for severe pain. 05/03/21   Edmisten, Ok Anis, PA  methocarbamol (ROBAXIN) 500 MG tablet Take 1 tablet (500 mg total) by mouth every 6 (six) hours as needed for muscle spasms. 05/03/21   Edmisten, Ok Anis, PA  traMADol (ULTRAM) 50 MG tablet Take 1-2 tablets (50-100 mg total) by mouth every 6 (six) hours as needed for moderate pain. 05/03/21   Edmisten, Ok Anis, PA  Turmeric (QC TUMERIC COMPLEX PO) Take 1 capsule by mouth daily.    [provider]   Social History   Socioeconomic History   Marital status: Married    Spouse name: Not on file   Number of children: 3   Years of education: Not on file   Highest education level: Bachelor's degree (e.g., BA, AB, BS)  Occupational History   Occupation: Retired    Fish farm manager: Retail banker  Tobacco Use   Smoking status: Never   Smokeless tobacco: Never  Vaping Use   Vaping Use: Never used  Substance and Sexual Activity   Alcohol use: Yes    Alcohol/week: 0.0 standard drinks of alcohol    Comment: Occasional   Drug use: No   Sexual activity: Yes  Other Topics Concern   Not on file  Social History Narrative   Married. Education: [employer]. Exercise: walk/bike 3 times a week for 30 minutes.   Social Determinants of Health   Financial Resource Strain: Low  Risk  (04/13/2018)   Overall Financial Resource Strain (CARDIA)    Difficulty of Paying Living Expenses: Not hard at all  Food Insecurity: No Food Insecurity (04/13/2018)   Hunger Vital Sign    Worried About Running Out of Food in the Last Year: Never true    Ran Out of Food in the Last Year: Never true  Transportation Needs: No Transportation Needs (04/13/2018)   PRAPARE - Hydrologist (Medical): No  Lack of Transportation (Non-Medical): No  Physical Activity: Insufficiently Active (04/13/2018)   Exercise Vital Sign    Days of Exercise per Week: 3 days    Minutes of Exercise per Session: 20 min  Stress: No Stress Concern Present (04/13/2018)   [place]    Feeling of Stress : Not at all  Social Connections: Moderately Integrated (04/13/2018)   Social Connection and Isolation Panel [NHANES]    Frequency of Communication with Friends and Family: More than three times a week    Frequency of Social Gatherings with Friends and Family: More than three times a week    Attends Religious Services: More than 4 times per year    Active Member of [employer] or Organizations: No    Attends Archivist Meetings: Never    Marital Status: Married  Human resources officer Violence: Not At Risk (04/13/2018)   Humiliation, Afraid, Rape, and Kick questionnaire    Fear of Current or Ex-Partner: No    Emotionally Abused: No    Physically Abused: No    Sexually Abused: No    Review of Systems   Objective:  There were no vitals filed for this visit. {Vitals History (Optional):23777}  Physical Exam     Assessment & Plan:  KENYON EICHELBERGER is a 76 y.o. male . No diagnosis found.   No orders of the defined types were placed in this encounter.  There are no Patient Instructions on file for this visit.    Signed,   [name], MD [facility] 10/07/21 10:27 PM

## 2021-10-08 ENCOUNTER — Encounter: Payer: Self-pay | Admitting: Family Medicine

## 2021-10-08 ENCOUNTER — Ambulatory Visit (INDEPENDENT_AMBULATORY_CARE_PROVIDER_SITE_OTHER): Payer: PPO | Admitting: Family Medicine

## 2021-10-08 VITALS — BP 112/70 | HR 60 | Temp 98.0°F | Resp 16 | Ht 70.0 in | Wt 170.2 lb

## 2021-10-08 DIAGNOSIS — R7309 Other abnormal glucose: Secondary | ICD-10-CM

## 2021-10-08 DIAGNOSIS — Z0000 Encounter for general adult medical examination without abnormal findings: Secondary | ICD-10-CM | POA: Diagnosis not present

## 2021-10-08 DIAGNOSIS — E785 Hyperlipidemia, unspecified: Secondary | ICD-10-CM | POA: Diagnosis not present

## 2021-10-08 LAB — LIPID PANEL
Cholesterol: 129 mg/dL (ref 0–200)
HDL: 50.8 mg/dL (ref >39.00)
LDL Cholesterol: 66 mg/dL (ref 0–99)
NonHDL: 77.88
Total CHOL/HDL Ratio: 3
Triglycerides: 60 mg/dL (ref 0.0–149.0)
VLDL: 12 mg/dL (ref 0.0–40.0)

## 2021-10-08 LAB — COMPREHENSIVE METABOLIC PANEL
ALT: 18 U/L (ref 0–53)
AST: 28 U/L (ref 0–37)
Albumin: 4.1 g/dL (ref 3.5–5.2)
Alkaline Phosphatase: 56 U/L (ref 39–117)
BUN: 15 mg/dL (ref 6–23)
CO2: 28 mEq/L (ref 19–32)
Calcium: 9.3 mg/dL (ref 8.4–10.5)
Chloride: 105 mEq/L (ref 96–112)
Creatinine, Ser: 1.07 mg/dL (ref 0.40–1.50)
GFR: 66.95 mL/min (ref >60.00)
Glucose, Bld: 88 mg/dL (ref 70–99)
Potassium: 3.9 mEq/L (ref 3.5–5.1)
Sodium: 138 mEq/L (ref 135–145)
Total Bilirubin: 0.7 mg/dL (ref 0.2–1.2)
Total Protein: 6.7 g/dL (ref 6.0–8.3)

## 2021-10-08 LAB — HEMOGLOBIN A1C: Hgb A1c MFr Bld: 6 % (ref 4.6–6.5)

## 2021-10-08 MED ORDER — ATORVASTATIN CALCIUM 10 MG PO TABS: ORAL_TABLET | ORAL | 3 refills | Status: DC

## 2021-10-08 NOTE — Patient Instructions (Addendum)
Thanks for coming in today.  No med changes at this time.  Repeat bloodwork today. If stable, then 1 year follow up. Take care!   Preventive Care 57 Years and Older, Male Preventive care refers to lifestyle choices and visits with your health care provider that can promote health and wellness. Preventive care visits are also called wellness exams. What can I expect for my preventive care visit? Counseling During your preventive care visit, your health care provider may ask about your: Medical history, including: Past medical problems. Family medical history. History of falls. Current health, including: Emotional well-being. Home life and relationship well-being. Sexual activity. Memory and ability to understand (cognition). Lifestyle, including: Alcohol, nicotine or tobacco, and drug use. Access to firearms. Diet, exercise, and sleep habits. Work and work Statistician. Sunscreen use. Safety issues such as seatbelt and bike helmet use. Physical exam Your health care provider will check your: Height and weight. These may be used to calculate your BMI (body mass index). BMI is a measurement that tells if you are at a healthy weight. Waist circumference. This measures the distance around your waistline. This measurement also tells if you are at a healthy weight and may help predict your risk of certain diseases, such as type 2 diabetes and high blood pressure. Heart rate and blood pressure. Body temperature. Skin for abnormal spots. What immunizations do I need?  Vaccines are usually given at various ages, according to a schedule. Your health care provider will recommend vaccines for you based on your age, medical history, and lifestyle or other factors, such as travel or where you work. What tests do I need? Screening Your health care provider may recommend screening tests for certain conditions. This may include: Lipid and cholesterol levels. Diabetes screening. This is done by  checking your blood sugar (glucose) after you have not eaten for a while (fasting). Hepatitis C test. Hepatitis B test. HIV (human immunodeficiency virus) test. STI (sexually transmitted infection) testing, if you are at risk. Lung cancer screening. Colorectal cancer screening. Prostate cancer screening. Abdominal aortic aneurysm (AAA) screening. You may need this if you are a current or former smoker. Talk with your health care provider about your test results, treatment options, and if necessary, the need for more tests. Follow these instructions at home: Eating and drinking  Eat a diet that includes fresh fruits and vegetables, whole grains, lean protein, and low-fat dairy products. Limit your intake of foods with high amounts of sugar, saturated fats, and salt. Take vitamin and mineral supplements as recommended by your health care provider. Do not drink alcohol if your health care provider tells you not to drink. If you drink alcohol: Limit how much you have to 0-2 drinks a day. Know how much alcohol is in your drink. In the [place] one drink equals one 12 oz bottle of beer (355 mL), one 5 oz glass of wine (148 mL), or one 1 oz glass of hard liquor (44 mL). Lifestyle Brush your teeth every morning and night with fluoride toothpaste. Floss one time each day. Exercise for at least 30 minutes 5 or more days each week. Do not use any products that contain nicotine or tobacco. These products include cigarettes, chewing tobacco, and vaping devices, such as e-cigarettes. If you need help quitting, ask your health care provider. Do not use drugs. If you are sexually active, practice safe sex. Use a condom or other form of protection to prevent STIs. Take aspirin only as told by your health care provider.  Make sure that you understand how much to take and what form to take. Work with your health care provider to find out whether it is safe and beneficial for you to take aspirin daily. Ask your  health care provider if you need to take a cholesterol-lowering medicine (statin). Find healthy ways to manage stress, such as: Meditation, yoga, or listening to music. Journaling. Talking to a trusted person. Spending time with friends and family. Safety Always wear your seat belt while driving or riding in a vehicle. Do not drive: If you have been drinking alcohol. Do not ride with someone who has been drinking. When you are tired or distracted. While texting. If you have been using any mind-altering substances or drugs. Wear a helmet and other protective equipment during sports activities. If you have firearms in your house, make sure you follow all gun safety procedures. Minimize exposure to UV radiation to reduce your risk of skin cancer. What's next? Visit your health care provider once a year for an annual wellness visit. Ask your health care provider how often you should have your eyes and teeth checked. Stay up to date on all vaccines. This information is not intended to replace advice given to you by your health care provider. Make sure you discuss any questions you have with your health care provider. Document Revised: 09/13/2020 Document Reviewed: 09/13/2020 Elsevier Patient Education  [place].

## 2021-11-05 DIAGNOSIS — Z7982 Long term (current) use of aspirin: Secondary | ICD-10-CM | POA: Diagnosis not present

## 2021-11-05 DIAGNOSIS — M1990 Unspecified osteoarthritis, unspecified site: Secondary | ICD-10-CM | POA: Diagnosis not present

## 2021-11-05 DIAGNOSIS — G8929 Other chronic pain: Secondary | ICD-10-CM | POA: Diagnosis not present

## 2021-11-05 DIAGNOSIS — E785 Hyperlipidemia, unspecified: Secondary | ICD-10-CM | POA: Diagnosis not present

## 2021-11-05 DIAGNOSIS — E663 Overweight: Secondary | ICD-10-CM | POA: Diagnosis not present

## 2021-11-12 DIAGNOSIS — M79642 Pain in left hand: Secondary | ICD-10-CM | POA: Diagnosis not present

## 2021-11-12 DIAGNOSIS — S60222A Contusion of left hand, initial encounter: Secondary | ICD-10-CM | POA: Diagnosis not present

## 2021-12-24 DIAGNOSIS — M79672 Pain in left foot: Secondary | ICD-10-CM | POA: Diagnosis not present

## 2021-12-24 DIAGNOSIS — L608 Other nail disorders: Secondary | ICD-10-CM | POA: Diagnosis not present

## 2022-07-25 ENCOUNTER — Telehealth: Payer: Self-pay | Admitting: Family Medicine

## 2022-07-25 NOTE — Telephone Encounter (Signed)
Contacted Anne Fu to schedule their annual wellness visit. Appointment made for 08/01/2022.  Thank you,  [facility] Support [facility] Direct dial  [phone]

## 2022-07-29 DIAGNOSIS — H26491 Other secondary cataract, right eye: Secondary | ICD-10-CM | POA: Diagnosis not present

## 2022-07-29 DIAGNOSIS — H43813 Vitreous degeneration, bilateral: Secondary | ICD-10-CM | POA: Diagnosis not present

## 2022-07-29 DIAGNOSIS — H5203 Hypermetropia, bilateral: Secondary | ICD-10-CM | POA: Diagnosis not present

## 2022-07-29 DIAGNOSIS — H2512 Age-related nuclear cataract, left eye: Secondary | ICD-10-CM | POA: Diagnosis not present

## 2022-07-29 DIAGNOSIS — H524 Presbyopia: Secondary | ICD-10-CM | POA: Diagnosis not present

## 2022-07-29 DIAGNOSIS — H52203 Unspecified astigmatism, bilateral: Secondary | ICD-10-CM | POA: Diagnosis not present

## 2022-07-29 DIAGNOSIS — H04123 Dry eye syndrome of bilateral lacrimal glands: Secondary | ICD-10-CM | POA: Diagnosis not present

## 2022-08-01 ENCOUNTER — Ambulatory Visit (INDEPENDENT_AMBULATORY_CARE_PROVIDER_SITE_OTHER): Payer: PPO | Admitting: *Deleted

## 2022-08-01 DIAGNOSIS — Z0000 Encounter for general adult medical examination without abnormal findings: Secondary | ICD-10-CM

## 2022-08-01 NOTE — Progress Notes (Signed)
Subjective:   Jesse Riddle is a 77 y.o. male who presents for Medicare Annual/Subsequent preventive examination.  I connected with  Anne Fu on 08/01/22 by a video enabled telemedicine application and verified that I am speaking with the correct person using two identifiers.   I discussed the limitations of evaluation and management by telemedicine. The patient expressed understanding and agreed to proceed.  Patient location: home  Provider location: in office   Review of Systems     Cardiac Risk Factors include: advanced age (>71men, >84 women);male gender     Objective:    Today's Vitals   There is no height or weight on file to calculate BMI.     08/01/2022    8:06 AM 05/02/2021    1:00 PM 05/02/2021    8:25 AM 04/25/2021   11:13 AM 05/12/2019   10:43 AM 04/13/2018    1:25 PM 04/09/2017    9:29 AM  Advanced Directives  Does Patient Have a Medical Advance Directive? Yes Yes Yes Yes Yes Yes Yes  Type of Estate agent of [place];Living will Healthcare Power of [place];Living will Healthcare Power of [place];Living will Healthcare Power of [place];Living will Living will Living will;Healthcare Power of [employer] Power of [place];Living will  Does patient want to make changes to medical advance directive?  No - Patient declined   No - Patient declined No - Patient declined   Copy of Healthcare Power of Attorney in Chart? No - copy requested No - copy requested No - copy requested No - copy requested   No - copy requested    Current Medications (verified) Outpatient Encounter Medications as of 08/01/2022  Medication Sig   atorvastatin (LIPITOR) 10 MG tablet Take 1 tablet by mouth once daily at 6pm   Coenzyme Q10 (CO Q-10 PO) Take 1 capsule by mouth daily.   fish oil-omega-3 fatty acids 1000 MG capsule Take 1 g by mouth daily.   Turmeric (QC TUMERIC COMPLEX PO) Take 1 capsule by mouth daily.   No facility-administered encounter medications  on file as of 08/01/2022.    Allergies (verified) Penicillins   History: Past Medical History:  Diagnosis Date   Allergy    penicillin   History of kidney stones    Past Surgical History:  Procedure Laterality Date   COLONOSCOPY     FINGER SURGERY     TONSILLECTOMY     TOTAL HIP ARTHROPLASTY Left 05/02/2021   Procedure: TOTAL HIP ARTHROPLASTY ANTERIOR APPROACH;  Surgeon: Ollen Gross, MD;  Location: [facility];  Service: Orthopedics;  Laterality: Left;   Family History  Problem Relation Age of Onset   Stroke Mother    Mental illness Father    Heart disease Brother    Heart disease Brother    Social History   Socioeconomic History   Marital status: Married    Spouse name: Not on file   Number of children: 3   Years of education: Not on file   Highest education level: Bachelor's degree (e.g., BA, AB, BS)  Occupational History   Occupation: Retired    Associate Professor: Sales promotion account executive  Tobacco Use   Smoking status: Never   Smokeless tobacco: Never  Vaping Use   Vaping Use: Never used  Substance and Sexual Activity   Alcohol use: Yes    Alcohol/week: 0.0 standard drinks of alcohol    Comment: Occasional   Drug use: No   Sexual activity: Yes  Other Topics Concern   Not on  file  Social History Narrative   Married. Education: [employer]. Exercise: walk/bike 3 times a week for 30 minutes.   Social Determinants of Health   Financial Resource Strain: Low Risk  (08/01/2022)   Overall Financial Resource Strain (CARDIA)    Difficulty of Paying Living Expenses: Not hard at all  Food Insecurity: No Food Insecurity (08/01/2022)   Hunger Vital Sign    Worried About Running Out of Food in the Last Year: Never true    Ran Out of Food in the Last Year: Never true  Transportation Needs: No Transportation Needs (08/01/2022)   PRAPARE - Administrator, Civil Service (Medical): No    Lack of Transportation (Non-Medical): No  Physical Activity: Sufficiently Active (08/01/2022)    Exercise Vital Sign    Days of Exercise per Week: 4 days    Minutes of Exercise per Session: 40 min  Stress: No Stress Concern Present (08/01/2022)   [employer] of Occupational Health - Occupational Stress Questionnaire    Feeling of Stress : Not at all  Social Connections: Socially Integrated (08/01/2022)   Social Connection and Isolation Panel [NHANES]    Frequency of Communication with Friends and Family: More than three times a week    Frequency of Social Gatherings with Friends and Family: Three times a week    Attends Religious Services: More than 4 times per year    Active Member of Clubs or Organizations: Yes    Attends Engineer, structural: More than 4 times per year    Marital Status: Married    Tobacco Counseling Counseling given: Not Answered   Clinical Intake:  Pre-visit preparation completed: Yes  Pain : No/denies pain     Diabetes: No  How often do you need to have someone help you when you read instructions, pamphlets, or other written materials from your doctor or pharmacy?: 1 - Never  Diabetic?  no  Interpreter Needed?: No  Information entered by :: Remi Haggard LPN   Activities of Daily Living    08/01/2022    8:08 AM  In your present state of health, do you have any difficulty performing the following activities:  Hearing? 0  Vision? 0  Difficulty concentrating or making decisions? 0  Walking or climbing stairs? 0  Dressing or bathing? 0  Doing errands, shopping? 0  Preparing Food and eating ? N  Using the Toilet? N  In the past six months, have you accidently leaked urine? N  Do you have problems with loss of bowel control? N  Managing your Medications? N  Managing your Finances? N    Patient Care Team: Shade Flood, MD as PCP - General (Family Medicine) Olivia Mackie, MD as Consulting Physician (Obstetrics and Gynecology)  Indicate any recent Medical Services you may have received from other than [facility] providers in the  past year (date may be approximate).     Assessment:   This is a routine wellness examination for Jesse Riddle.  Hearing/Vision screen Hearing Screening - Comments:: No trouble hearing Vision Screening - Comments:: Up to date [facility]   Dietary issues and exercise activities discussed: Current Exercise Habits: Structured exercise class, Type of exercise: walking;strength training/weights;stretching, Time (Minutes): 45, Frequency (Times/Week): 4, Weekly Exercise (Minutes/Week): 180, Intensity: Moderate   Goals Addressed             This Visit's Progress    Exercise 3x per week (30 min per time)   On track    Patient states that  he wants to try to start exercising more on a daily basis.      Patient Stated       Increase activity with bike riding Continue lifestyle       Depression Screen    08/01/2022    8:12 AM 10/08/2021    8:08 AM 04/16/2021    8:09 AM 09/28/2020    2:33 PM 05/12/2019    9:58 AM 04/13/2018    1:41 PM 04/13/2018    1:12 PM  PHQ 2/9 Scores  PHQ - 2 Score 0 0 0 0 0 0 0  PHQ- 9 Score 0  0        Fall Risk    08/01/2022    8:05 AM 10/08/2021    8:08 AM 04/16/2021    8:08 AM 09/28/2020    2:33 PM 05/12/2019    9:58 AM  Fall Risk   Falls in the past year? 0 0 0 0 0  Number falls in past yr: 0 0 0 0 0  Injury with Fall? 0 0 0 0 0  Risk for fall due to :  No Fall Risks No Fall Risks    Follow up Falls evaluation completed;Education provided;Falls prevention discussed Falls evaluation completed Falls evaluation completed  Falls evaluation completed    FALL RISK PREVENTION PERTAINING TO THE HOME:  Any stairs in or around the home? No  If so, are there any without handrails? No  Home free of loose throw rugs in walkways, pet beds, electrical cords, etc? Yes  Adequate lighting in your home to reduce risk of falls? Yes   ASSISTIVE DEVICES UTILIZED TO PREVENT FALLS:  Life alert? No  Use of a cane, walker or w/c? No  Grab bars in the bathroom? No   Shower chair or bench in shower? Yes  Elevated toilet seat or a handicapped toilet? Yes   TIMED UP AND GO:  Was the test performed? No .    Cognitive Function:        08/01/2022    8:09 AM 09/28/2020    2:33 PM 05/12/2019   10:04 AM 04/13/2018    1:42 PM 04/13/2018    1:12 PM  6CIT Screen  What Year? 0 points 0 points 0 points 0 points 0 points  What month? 0 points 0 points 0 points 0 points 0 points  What time? 0 points 0 points 0 points 0 points 0 points  Count back from 20 0 points 0 points 0 points 0 points 0 points  Months in reverse 0 points 0 points 0 points 0 points 0 points  Repeat phrase 0 points 0 points 2 points 0 points 0 points  Total Score 0 points 0 points 2 points 0 points 0 points    Immunizations Immunization History  Administered Date(s) Administered   Influenza,inj,Quad PF,6+ Mos 12/27/2013, 11/25/2016   Influenza-Unspecified 12/31/2014, 01/08/2016, 12/21/2020   PFIZER(Purple Top)SARS-COV-2 Vaccination 04/21/2019, 05/14/2019   Pneumococcal Conjugate-13 12/27/2013   Pneumococcal Polysaccharide-23 11/22/2011   Tdap 11/22/2011   Zoster, Live 02/27/2013    TDAP status: Due, Education has been provided regarding the importance of this vaccine. Advised may receive this vaccine at local pharmacy or Health Dept. Aware to provide a copy of the vaccination record if obtained from local pharmacy or Health Dept. Verbalized acceptance and understanding.  Flu Vaccine status: Up to date  Pneumococcal vaccine status: Up to date  Covid-19 vaccine status: Information provided on how to obtain vaccines.   Qualifies for Shingles Vaccine?  Yes   Zostavax completed No   Shingrix Completed?: Yes  Screening Tests Health Maintenance  Topic Date Due   Zoster Vaccines- Shingrix (1 of 2) Never done   DTaP/Tdap/Td (2 - Td or Tdap) 11/21/2021   COVID-19 Vaccine (3 - 2023-24 season) 08/17/2022 (Originally 11/30/2021)   INFLUENZA VACCINE  10/31/2022   Medicare Annual  Wellness (AWV)  08/01/2023   Pneumonia Vaccine 2+ Years old  Completed   Hepatitis C Screening  Completed   HPV VACCINES  Aged Out   COLONOSCOPY (Pts 45-49yrs Insurance coverage will need to be confirmed)  Discontinued    Health Maintenance  Health Maintenance Due  Topic Date Due   Zoster Vaccines- Shingrix (1 of 2) Never done   DTaP/Tdap/Td (2 - Td or Tdap) 11/21/2021    Colorectal cancer screening: No longer required.   Lung Cancer Screening: (Low Dose CT Chest recommended if Age 44-80 years, 30 pack-year currently smoking OR have quit w/in 15years.) does not qualify.   Lung Cancer Screening Referral:   Additional Screening:  Hepatitis C Screening: does not qualify; Completed 2017  Vision Screening: Recommended annual ophthalmology exams for early detection of glaucoma and other disorders of the eye. Is the patient up to date with their annual eye exam?  Yes  Who is the provider or what is the name of the office in which the patient attends annual eye exams? [facility] If pt is not established with a provider, would they like to be referred to a provider to establish care? No .   Dental Screening: Recommended annual dental exams for proper oral hygiene  Community Resource Referral / Chronic Care Management: CRR required this visit?  No   CCM required this visit?  No      Plan:     I have personally reviewed and noted the following in the patient's chart:   Medical and social history Use of alcohol, tobacco or illicit drugs  Current medications and supplements including opioid prescriptions. Patient is not currently taking opioid prescriptions. Functional ability and status Nutritional status Physical activity Advanced directives List of other physicians Hospitalizations, surgeries, and ER visits in previous 12 months Vitals Screenings to include cognitive, depression, and falls Referrals and appointments  In addition, I have reviewed and  discussed with patient certain preventive protocols, quality metrics, and best practice recommendations. A written personalized care plan for preventive services as well as general preventive health recommendations were provided to patient.     Remi Haggard, LPN   09/01/9526   Nurse Notes:

## 2022-08-01 NOTE — Patient Instructions (Signed)
Jesse Riddle , Thank you for taking time to come for your Medicare Wellness Visit. I appreciate your ongoing commitment to your health goals. Please review the following plan we discussed and let me know if I can assist you in the future.   Screening recommendations/referrals: Colonoscopy: no longer required Recommended yearly ophthalmology/optometry visit for glaucoma screening and checkup Recommended yearly dental visit for hygiene and checkup  Vaccinations: Influenza vaccine: up to date Pneumococcal vaccine: up to date Tdap vaccine: Education provided Shingles vaccine: up to date    Advanced directives: yes not on file    Preventive Care 65 Years and Older, Male Preventive care refers to lifestyle choices and visits with your health care provider that can promote health and wellness. What does preventive care include? A yearly physical exam. This is also called an annual well check. Dental exams once or twice a year. Routine eye exams. Ask your health care provider how often you should have your eyes checked. Personal lifestyle choices, including: Daily care of your teeth and gums. Regular physical activity. Eating a healthy diet. Avoiding tobacco and drug use. Limiting alcohol use. Practicing safe sex. Taking low doses of aspirin every day. Taking vitamin and mineral supplements as recommended by your health care provider. What happens during an annual well check? The services and screenings done by your health care provider during your annual well check will depend on your age, overall health, lifestyle risk factors, and family history of disease. Counseling  Your health care provider may ask you questions about your: Alcohol use. Tobacco use. Drug use. Emotional well-being. Home and relationship well-being. Sexual activity. Eating habits. History of falls. Memory and ability to understand (cognition). Work and work Astronomer. Screening  You may have the following  tests or measurements: Height, weight, and BMI. Blood pressure. Lipid and cholesterol levels. These may be checked every 5 years, or more frequently if you are over 68 years old. Skin check. Lung cancer screening. You may have this screening every year starting at age 74 if you have a 30-pack-year history of smoking and currently smoke or have quit within the past 15 years. Fecal occult blood test (FOBT) of the stool. You may have this test every year starting at age 67. Flexible sigmoidoscopy or colonoscopy. You may have a sigmoidoscopy every 5 years or a colonoscopy every 10 years starting at age 55. Prostate cancer screening. Recommendations will vary depending on your family history and other risks. Hepatitis C blood test. Hepatitis B blood test. Sexually transmitted disease (STD) testing. Diabetes screening. This is done by checking your blood sugar (glucose) after you have not eaten for a while (fasting). You may have this done every 1-3 years. Abdominal aortic aneurysm (AAA) screening. You may need this if you are a current or former smoker. Osteoporosis. You may be screened starting at age 14 if you are at high risk. Talk with your health care provider about your test results, treatment options, and if necessary, the need for more tests. Vaccines  Your health care provider may recommend certain vaccines, such as: Influenza vaccine. This is recommended every year. Tetanus, diphtheria, and acellular pertussis (Tdap, Td) vaccine. You may need a Td booster every 10 years. Zoster vaccine. You may need this after age 30. Pneumococcal 13-valent conjugate (PCV13) vaccine. One dose is recommended after age 34. Pneumococcal polysaccharide (PPSV23) vaccine. One dose is recommended after age 30. Talk to your health care provider about which screenings and vaccines you need and how often you need  them. This information is not intended to replace advice given to you by your health care provider.  Make sure you discuss any questions you have with your health care provider. Document Released: 04/14/2015 Document Revised: 12/06/2015 Document Reviewed: 01/17/2015 Elsevier Interactive Patient Education  2017 [place] Prevention in the Home Falls can cause injuries. They can happen to people of all ages. There are many things you can do to make your home safe and to help prevent falls. What can I do on the outside of my home? Regularly fix the edges of walkways and driveways and fix any cracks. Remove anything that might make you trip as you walk through a door, such as a raised step or threshold. Trim any bushes or trees on the path to your home. Use bright outdoor lighting. Clear any walking paths of anything that might make someone trip, such as rocks or tools. Regularly check to see if handrails are loose or broken. Make sure that both sides of any steps have handrails. Any raised decks and porches should have guardrails on the edges. Have any leaves, snow, or ice cleared regularly. Use sand or salt on walking paths during winter. Clean up any spills in your garage right away. This includes oil or grease spills. What can I do in the bathroom? Use night lights. Install grab bars by the toilet and in the tub and shower. Do not use towel bars as grab bars. Use non-skid mats or decals in the tub or shower. If you need to sit down in the shower, use a plastic, non-slip stool. Keep the floor dry. Clean up any water that spills on the floor as soon as it happens. Remove soap buildup in the tub or shower regularly. Attach bath mats securely with double-sided non-slip rug tape. Do not have throw rugs and other things on the floor that can make you trip. What can I do in the bedroom? Use night lights. Make sure that you have a light by your bed that is easy to reach. Do not use any sheets or blankets that are too big for your bed. They should not hang down onto the floor. Have a  firm chair that has side arms. You can use this for support while you get dressed. Do not have throw rugs and other things on the floor that can make you trip. What can I do in the kitchen? Clean up any spills right away. Avoid walking on wet floors. Keep items that you use a lot in easy-to-reach places. If you need to reach something above you, use a strong step stool that has a grab bar. Keep electrical cords out of the way. Do not use floor polish or wax that makes floors slippery. If you must use wax, use non-skid floor wax. Do not have throw rugs and other things on the floor that can make you trip. What can I do with my stairs? Do not leave any items on the stairs. Make sure that there are handrails on both sides of the stairs and use them. Fix handrails that are broken or loose. Make sure that handrails are as long as the stairways. Check any carpeting to make sure that it is firmly attached to the stairs. Fix any carpet that is loose or worn. Avoid having throw rugs at the top or bottom of the stairs. If you do have throw rugs, attach them to the floor with carpet tape. Make sure that you have a light switch at  the top of the stairs and the bottom of the stairs. If you do not have them, ask someone to add them for you. What else can I do to help prevent falls? Wear shoes that: Do not have high heels. Have rubber bottoms. Are comfortable and fit you well. Are closed at the toe. Do not wear sandals. If you use a stepladder: Make sure that it is fully opened. Do not climb a closed stepladder. Make sure that both sides of the stepladder are locked into place. Ask someone to hold it for you, if possible. Clearly mark and make sure that you can see: Any grab bars or handrails. First and last steps. Where the edge of each step is. Use tools that help you move around (mobility aids) if they are needed. These include: Canes. Walkers. Scooters. Crutches. Turn on the lights when you  go into a dark area. Replace any light bulbs as soon as they burn out. Set up your furniture so you have a clear path. Avoid moving your furniture around. If any of your floors are uneven, fix them. If there are any pets around you, be aware of where they are. Review your medicines with your doctor. Some medicines can make you feel dizzy. This can increase your chance of falling. Ask your doctor what other things that you can do to help prevent falls. This information is not intended to replace advice given to you by your health care provider. Make sure you discuss any questions you have with your health care provider. Document Released: 01/12/2009 Document Revised: 08/24/2015 Document Reviewed: 04/22/2014 Elsevier Interactive Patient Education  2017 [employer].

## 2022-10-09 ENCOUNTER — Encounter: Payer: PPO | Admitting: Family Medicine

## 2022-10-13 NOTE — Progress Notes (Unsigned)
Subjective:  Patient ID: Jesse Riddle, male    DOB: [birth year]  Age: 77 y.o. MRN: [id number]  CC:  Chief Complaint  Patient presents with   Annual Exam    Pt here for annual notes a small rash on his face the last few weeks on cheeks, want to ensure this is not a major concern has been treating at home     HPI Jesse Riddle presents for Annual Exam  PCP, [facility], Dr. Delia Chimes, Dr.Aluisio , total hip arthroplasty with 05/2021. Doing well.   Face rash: Few red bumps on left cheek, past 6 weeks, no itching. Hydrocortisone past 2 weeks  few times per day. Helping.  Dermatologist: Dr. Karlyn Agee.   Prediabetes: Diet/exercise approach. No diet/exercise changes - possibly more exercise now.  Lab Results  Component Value Date   HGBA1C 6.0 10/08/2021   Wt Readings from Last 3 Encounters:  10/14/22 170 lb (77.1 kg)  10/08/21 170 lb 3.2 oz (77.2 kg)  05/02/21 164 lb 9.6 oz (74.7 kg)   Hyperlipidemia: Lipitor 10 mg daily, has taken 81 mg aspirin daily as well, risk versus benefits discussed previously.  Coronary calcium score in June 2016 with moderate calcified coronary plaque, option of higher dose statin. No new myalgias/side effects.  Lab Results  Component Value Date   CHOL 129 10/08/2021   HDL 50.80 10/08/2021   LDLCALC 66 10/08/2021   TRIG 60.0 10/08/2021   CHOLHDL 3 10/08/2021   Lab Results  Component Value Date   ALT 18 10/08/2021   AST 28 10/08/2021   ALKPHOS 56 10/08/2021   BILITOT 0.7 10/08/2021       08/01/2022    8:12 AM 10/08/2021    8:08 AM 04/16/2021    8:09 AM 09/28/2020    2:33 PM 05/12/2019    9:58 AM  Depression screen PHQ 2/9  Decreased Interest 0 0 0 0 0  Down, Depressed, Hopeless 0 0 0 0 0  PHQ - 2 Score 0 0 0 0 0  Altered sleeping 0  0    Tired, decreased energy 0  0    Change in appetite 0  0    Feeling bad or failure about yourself  0  0    Trouble concentrating 0  0    Moving slowly or fidgety/restless 0  0    Suicidal  thoughts 0  0    PHQ-9 Score 0  0    Difficult doing work/chores Not difficult at all        Health Maintenance  Topic Date Due   Zoster Vaccines- Shingrix (1 of 2) 04/18/1994   DTaP/Tdap/Td (2 - Td or Tdap) 11/21/2021   COVID-19 Vaccine (3 - 2023-24 season) 11/30/2021   INFLUENZA VACCINE  10/31/2022   Medicare Annual Wellness (AWV)  08/01/2023   Pneumonia Vaccine 24+ Years old  Completed   Hepatitis C Screening  Completed   HPV VACCINES  Aged Out   Colonoscopy  Discontinued  Previous colonoscopy with [facility], approximately 2022.  No further testing needed.   Immunization History  Administered Date(s) Administered   Influenza,inj,Quad PF,6+ Mos 12/27/2013, 11/25/2016   Influenza-Unspecified 12/31/2014, 01/08/2016, 12/21/2020   PFIZER(Purple Top)SARS-COV-2 Vaccination 04/21/2019, 05/14/2019   Pneumococcal Conjugate-13 12/27/2013   Pneumococcal Polysaccharide-23 11/22/2011   Tdap 11/22/2011   Zoster, Live 02/27/2013  Has received Shingrix at pharmacy COVID booster: recommended Tdap - option through pharmacy   No results found. Followed by ophthalmology regularly with history of cataracts,  yearly visits. Recent visit a month ago.   Dental: every 6 months. Doing well.   Alcohol: 1 beer per week.   Tobacco: none.   Exercise: work at home, gym 3 days a week and extra walking.    History Patient Active Problem List   Diagnosis Date Noted   OA (osteoarthritis) of hip 05/02/2021   S/P total left hip arthroplasty 05/02/2021   Past Medical History:  Diagnosis Date   Allergy    penicillin   History of kidney stones    Past Surgical History:  Procedure Laterality Date   COLONOSCOPY     FINGER SURGERY     TONSILLECTOMY     TOTAL HIP ARTHROPLASTY Left 05/02/2021   Procedure: TOTAL HIP ARTHROPLASTY ANTERIOR APPROACH;  Surgeon: Ollen Gross, MD;  Location: [facility];  Service: Orthopedics;  Laterality: Left;   Allergies  Allergen Reactions   Penicillins Other (See  Comments)    Child Tolerated Cephalosporin Date: 05/03/21.     Prior to Admission medications   Medication Sig Start Date End Date Taking? Authorizing Provider  atorvastatin (LIPITOR) 10 MG tablet Take 1 tablet by mouth once daily at 6pm 10/08/21   Shade Flood, MD  Coenzyme Q10 (CO Q-10 PO) Take 1 capsule by mouth daily.    [provider]  fish oil-omega-3 fatty acids 1000 MG capsule Take 1 g by mouth daily.    [provider]  Turmeric (QC TUMERIC COMPLEX PO) Take 1 capsule by mouth daily.    [provider]   Social History   Socioeconomic History   Marital status: Married    Spouse name: Not on file   Number of children: 3   Years of education: Not on file   Highest education level: Bachelor's degree (e.g., BA, AB, BS)  Occupational History   Occupation: Retired    Associate Professor: Sales promotion account executive  Tobacco Use   Smoking status: Never   Smokeless tobacco: Never  Vaping Use   Vaping status: Never Used  Substance and Sexual Activity   Alcohol use: Yes    Alcohol/week: 1.0 standard drink of alcohol    Types: 1 Cans of beer per week    Comment: Occasional   Drug use: No   Sexual activity: Yes  Other Topics Concern   Not on file  Social History Narrative   Married. Education: [employer]. Exercise: walk/bike 3 times a week for 30 minutes.   Social Determinants of Health   Financial Resource Strain: Low Risk  (08/01/2022)   Overall Financial Resource Strain (CARDIA)    Difficulty of Paying Living Expenses: Not hard at all  Food Insecurity: No Food Insecurity (08/01/2022)   Hunger Vital Sign    Worried About Running Out of Food in the Last Year: Never true    Ran Out of Food in the Last Year: Never true  Transportation Needs: No Transportation Needs (08/01/2022)   PRAPARE - Administrator, Civil Service (Medical): No    Lack of Transportation (Non-Medical): No  Physical Activity: Sufficiently Active (08/01/2022)   Exercise Vital Sign     Days of Exercise per Week: 4 days    Minutes of Exercise per Session: 40 min  Stress: No Stress Concern Present (08/01/2022)   [employer] of Occupational Health - Occupational Stress Questionnaire    Feeling of Stress : Not at all  Social Connections: Socially Integrated (08/01/2022)   Social Connection and Isolation Panel [NHANES]    Frequency of Communication with Friends and  Family: More than three times a week    Frequency of Social Gatherings with Friends and Family: Three times a week    Attends Religious Services: More than 4 times per year    Active Member of Clubs or Organizations: Yes    Attends Banker Meetings: More than 4 times per year    Marital Status: Married  Catering manager Violence: Not At Risk (08/01/2022)   Humiliation, Afraid, Rape, and Kick questionnaire    Fear of Current or Ex-Partner: No    Emotionally Abused: No    Physically Abused: No    Sexually Abused: No    Review of Systems 13 point review of systems per patient health survey noted.  Negative other than as indicated above or in HPI.    Objective:   Vitals:   10/14/22 1437  BP: 118/62  Pulse: 74  Temp: 98.5 F (36.9 C)  TempSrc: Temporal  SpO2: 98%  Weight: 170 lb (77.1 kg)  Height: 5\' 9"  (1.753 m)     Physical Exam Vitals reviewed.  Constitutional:      Appearance: He is well-developed.  HENT:     Head: Normocephalic and atraumatic.     Right Ear: External ear normal.     Left Ear: External ear normal.  Eyes:     Conjunctiva/sclera: Conjunctivae normal.     Pupils: Pupils are equal, round, and reactive to light.  Neck:     Thyroid: No thyromegaly.  Cardiovascular:     Rate and Rhythm: Normal rate and regular rhythm.     Heart sounds: Normal heart sounds.  Pulmonary:     Effort: Pulmonary effort is normal. No respiratory distress.     Breath sounds: Normal breath sounds. No wheezing.  Abdominal:     General: There is no distension.     Palpations: Abdomen is  soft.     Tenderness: There is no abdominal tenderness.  Musculoskeletal:        General: No tenderness. Normal range of motion.     Cervical back: Normal range of motion and neck supple.  Lymphadenopathy:     Cervical: No cervical adenopathy.  Skin:    General: Skin is warm and dry.     Comments: Patch of erythema, faint papules on left malar prominence, not appreciated on nose or right side.  Possible few small telangiectasia in the left malar prominence.  See photo.  Neurological:     Mental Status: He is alert and oriented to person, place, and time.     Deep Tendon Reflexes: Reflexes are normal and symmetric.  Psychiatric:        Behavior: Behavior normal.       Assessment & Plan:  Jesse Riddle is a 77 y.o. male . Annual physical exam  - -anticipatory guidance as below in AVS, screening labs above. Health maintenance items as above in HPI discussed/recommended as applicable.   Elevated hemoglobin A1c - Plan: Hemoglobin A1c  -Continue to watch diet, exercise, updated A1c ordered  Hyperlipidemia, unspecified hyperlipidemia type - Plan: Comprehensive metabolic panel, Lipid panel, atorvastatin (LIPITOR) 10 MG tablet  -Tolerating current dose Lipitor, continue same with updated labs.  Calcium scoring discussed with option to increase dose of statin but has chosen remain on current dosing of Lipitor.  No changes for now.  Rash of face - Plan: metroNIDAZOLE (METROGEL) 1 % gel  -Based on location and appearance possible mild rosacea.  Trial of metronidazole gel but if not improving he consult  his dermatologist.  Handout given.  RTC if worse.  Meds ordered this encounter  Medications   atorvastatin (LIPITOR) 10 MG tablet    Sig: Take 1 tablet by mouth once daily at 6pm    Dispense:  90 tablet    Refill:  3    Ok to place on hold if needed.   metroNIDAZOLE (METROGEL) 1 % gel    Sig: Apply topically daily.    Dispense:  45 g    Refill:  0   Patient Instructions  Rash of  face could be a mild form of rosacea.  Can try metronidazole gel once per day but if that is not helping in the next week or two, I recommend following up with your dermatologist.    No medication changes otherwise today.  If any concerns on labs I will let you know. Take care!  Rosacea Rosacea is a long-term (chronic) condition that affects the skin of the face, including the cheeks, nose, forehead, and chin. This condition can also affect the eyes. Rosacea causes blood vessels near the surface of the skin to enlarge, which results in redness. What are the causes? The cause of this condition is not known. Certain triggers can make rosacea worse, including: Exercise. Sunlight. Very hot or cold temperatures. Hot or spicy foods and drinks. Drinking alcohol. Stress. Taking blood pressure medicine. Long-term use of topical steroids on the face. What increases the risk? You are more likely to develop this condition if you: Are older than 78 years of age. Are a woman. Have light-colored skin (light complexion). Have a family history of rosacea. What are the signs or symptoms? Symptoms of this condition include: Redness of the face. Red bumps or pimples on the face. A red, enlarged nose. Blushing easily. Red lines on the skin. Eye problems such as: Irritated, burning, or itchy feeling in the eyes. Swollen eyelids. Drainage from the eyes. Feeling like there is something in your eye. How is this diagnosed? This condition is diagnosed with a medical history and physical exam. How is this treated? There is no cure for this condition, but treatment can help to control your symptoms. Your health care provider may recommend that you see a skin specialist (dermatologist). Treatment may include: Medicines that are applied to the skin or taken by mouth (orally). This can include antibiotic medicines. Laser treatment to improve the appearance of the skin. Surgery. This is rare. Your health  care provider will also recommend the best way to take care of your skin. Even after your skin improves, you will likely need to continue treatment to prevent your rosacea from coming back. Follow these instructions at home: Skin care Take care of your skin as told by your health care provider. You may be told to do these things: Wash your skin gently two or more times each day. Use mild soap. Use a sunscreen or sunblock with SPF 30 or greater. Use gentle cosmetics that are meant for sensitive skin. Shave with an electric shaver instead of a blade. Lifestyle Try to keep track of what foods trigger this condition. Avoid any triggers. These may include: Spicy foods. Seafood. Cheese. Hot liquids. Nuts. Chocolate. Iodized salt. Do not drink alcohol. Avoid extremely cold or hot temperatures. Try to reduce your stress. If you need help, talk with your health care provider. When you exercise, do these things to stay cool: Limit sun exposure to your face. Use a fan. Do shorter and more frequent intervals of exercise. General instructions  Take and apply over-the-counter and prescription medicines only as told by your health care provider. If you were prescribed antibiotics, apply it or take them as told by your health care provider. Do not stop using the antibiotic even if your condition improves. If your eyelids are affected, apply warm compresses to them. Do this as told by your health care provider. Keep all follow-up visits. Contact a health care provider if: Your symptoms get worse. Your symptoms do not improve after 2 months of treatment. You have new symptoms. You have any changes in vision or you have problems with your eyes, such as redness or itching. You feel depressed. You lose your appetite. You have trouble concentrating. Summary Rosacea is a long-term (chronic) condition that affects the skin of the face, including the cheeks, nose, forehead, and chin. Take care of your  skin as told by your health care provider. Take and apply over-the-counter and prescription medicines only as told by your health care provider. Contact a health care provider if your symptoms get worse or if you have any changes in vision or other problems with your eyes, such as redness or itching. Keep all follow-up visits. This information is not intended to replace advice given to you by your health care provider. Make sure you discuss any questions you have with your health care provider. Document Revised: 05/09/2021 Document Reviewed: 05/09/2021 Elsevier Patient Education  2024 [employer].   Preventive Care 65 Years and Older, Male Preventive care refers to lifestyle choices and visits with your health care provider that can promote health and wellness. Preventive care visits are also called wellness exams. What can I expect for my preventive care visit? Counseling During your preventive care visit, your health care provider may ask about your: Medical history, including: Past medical problems. Family medical history. History of falls. Current health, including: Emotional well-being. Home life and relationship well-being. Sexual activity. Memory and ability to understand (cognition). Lifestyle, including: Alcohol, nicotine or tobacco, and drug use. Access to firearms. Diet, exercise, and sleep habits. Work and work Astronomer. Sunscreen use. Safety issues such as seatbelt and bike helmet use. Physical exam Your health care provider will check your: Height and weight. These may be used to calculate your BMI (body mass index). BMI is a measurement that tells if you are at a healthy weight. Waist circumference. This measures the distance around your waistline. This measurement also tells if you are at a healthy weight and may help predict your risk of certain diseases, such as type 2 diabetes and high blood pressure. Heart rate and blood pressure. Body temperature. Skin  for abnormal spots. What immunizations do I need?  Vaccines are usually given at various ages, according to a schedule. Your health care provider will recommend vaccines for you based on your age, medical history, and lifestyle or other factors, such as travel or where you work. What tests do I need? Screening Your health care provider may recommend screening tests for certain conditions. This may include: Lipid and cholesterol levels. Diabetes screening. This is done by checking your blood sugar (glucose) after you have not eaten for a while (fasting). Hepatitis C test. Hepatitis B test. HIV (human immunodeficiency virus) test. STI (sexually transmitted infection) testing, if you are at risk. Lung cancer screening. Colorectal cancer screening. Prostate cancer screening. Abdominal aortic aneurysm (AAA) screening. You may need this if you are a current or former smoker. Talk with your health care provider about your test results, treatment options, and if necessary,  the need for more tests. Follow these instructions at home: Eating and drinking  Eat a diet that includes fresh fruits and vegetables, whole grains, lean protein, and low-fat dairy products. Limit your intake of foods with high amounts of sugar, saturated fats, and salt. Take vitamin and mineral supplements as recommended by your health care provider. Do not drink alcohol if your health care provider tells you not to drink. If you drink alcohol: Limit how much you have to 0-2 drinks a day. Know how much alcohol is in your drink. In the [place] one drink equals one 12 oz bottle of beer (355 mL), one 5 oz glass of wine (148 mL), or one 1 oz glass of hard liquor (44 mL). Lifestyle Brush your teeth every morning and night with fluoride toothpaste. Floss one time each day. Exercise for at least 30 minutes 5 or more days each week. Do not use any products that contain nicotine or tobacco. These products include cigarettes, chewing  tobacco, and vaping devices, such as e-cigarettes. If you need help quitting, ask your health care provider. Do not use drugs. If you are sexually active, practice safe sex. Use a condom or other form of protection to prevent STIs. Take aspirin only as told by your health care provider. Make sure that you understand how much to take and what form to take. Work with your health care provider to find out whether it is safe and beneficial for you to take aspirin daily. Ask your health care provider if you need to take a cholesterol-lowering medicine (statin). Find healthy ways to manage stress, such as: Meditation, yoga, or listening to music. Journaling. Talking to a trusted person. Spending time with friends and family. Safety Always wear your seat belt while driving or riding in a vehicle. Do not drive: If you have been drinking alcohol. Do not ride with someone who has been drinking. When you are tired or distracted. While texting. If you have been using any mind-altering substances or drugs. Wear a helmet and other protective equipment during sports activities. If you have firearms in your house, make sure you follow all gun safety procedures. Minimize exposure to UV radiation to reduce your risk of skin cancer. What's next? Visit your health care provider once a year for an annual wellness visit. Ask your health care provider how often you should have your eyes and teeth checked. Stay up to date on all vaccines. This information is not intended to replace advice given to you by your health care provider. Make sure you discuss any questions you have with your health care provider. Document Revised: 09/13/2020 Document Reviewed: 09/13/2020 Elsevier Patient Education  2024 Elsevier Inc.     Signed,   Meredith Staggers, MD [facility], [facility] 10/14/22 3:23 PM

## 2022-10-14 ENCOUNTER — Encounter: Payer: Self-pay | Admitting: Family Medicine

## 2022-10-14 ENCOUNTER — Ambulatory Visit (INDEPENDENT_AMBULATORY_CARE_PROVIDER_SITE_OTHER): Payer: PPO | Admitting: Family Medicine

## 2022-10-14 VITALS — BP 118/62 | HR 74 | Temp 98.5°F | Ht 69.0 in | Wt 170.0 lb

## 2022-10-14 DIAGNOSIS — R7309 Other abnormal glucose: Secondary | ICD-10-CM

## 2022-10-14 DIAGNOSIS — E785 Hyperlipidemia, unspecified: Secondary | ICD-10-CM | POA: Diagnosis not present

## 2022-10-14 DIAGNOSIS — Z0000 Encounter for general adult medical examination without abnormal findings: Secondary | ICD-10-CM

## 2022-10-14 DIAGNOSIS — M1990 Unspecified osteoarthritis, unspecified site: Secondary | ICD-10-CM | POA: Diagnosis not present

## 2022-10-14 DIAGNOSIS — R21 Rash and other nonspecific skin eruption: Secondary | ICD-10-CM

## 2022-10-14 MED ORDER — ATORVASTATIN CALCIUM 10 MG PO TABS: ORAL_TABLET | ORAL | 3 refills | Status: DC

## 2022-10-14 MED ORDER — METRONIDAZOLE 1 % EX GEL: Freq: Every day | CUTANEOUS | 0 refills | Status: DC

## 2022-10-14 NOTE — Patient Instructions (Signed)
Rash of face could be a mild form of rosacea.  Can try metronidazole gel once per day but if that is not helping in the next week or two, I recommend following up with your dermatologist.    No medication changes otherwise today.  If any concerns on labs I will let you know. Take care!  Rosacea Rosacea is a long-term (chronic) condition that affects the skin of the face, including the cheeks, nose, forehead, and chin. This condition can also affect the eyes. Rosacea causes blood vessels near the surface of the skin to enlarge, which results in redness. What are the causes? The cause of this condition is not known. Certain triggers can make rosacea worse, including: Exercise. Sunlight. Very hot or cold temperatures. Hot or spicy foods and drinks. Drinking alcohol. Stress. Taking blood pressure medicine. Long-term use of topical steroids on the face. What increases the risk? You are more likely to develop this condition if you: Are older than 78 years of age. Are a woman. Have light-colored skin (light complexion). Have a family history of rosacea. What are the signs or symptoms? Symptoms of this condition include: Redness of the face. Red bumps or pimples on the face. A red, enlarged nose. Blushing easily. Red lines on the skin. Eye problems such as: Irritated, burning, or itchy feeling in the eyes. Swollen eyelids. Drainage from the eyes. Feeling like there is something in your eye. How is this diagnosed? This condition is diagnosed with a medical history and physical exam. How is this treated? There is no cure for this condition, but treatment can help to control your symptoms. Your health care provider may recommend that you see a skin specialist (dermatologist). Treatment may include: Medicines that are applied to the skin or taken by mouth (orally). This can include antibiotic medicines. Laser treatment to improve the appearance of the skin. Surgery. This is rare. Your  health care provider will also recommend the best way to take care of your skin. Even after your skin improves, you will likely need to continue treatment to prevent your rosacea from coming back. Follow these instructions at home: Skin care Take care of your skin as told by your health care provider. You may be told to do these things: Wash your skin gently two or more times each day. Use mild soap. Use a sunscreen or sunblock with SPF 30 or greater. Use gentle cosmetics that are meant for sensitive skin. Shave with an electric shaver instead of a blade. Lifestyle Try to keep track of what foods trigger this condition. Avoid any triggers. These may include: Spicy foods. Seafood. Cheese. Hot liquids. Nuts. Chocolate. Iodized salt. Do not drink alcohol. Avoid extremely cold or hot temperatures. Try to reduce your stress. If you need help, talk with your health care provider. When you exercise, do these things to stay cool: Limit sun exposure to your face. Use a fan. Do shorter and more frequent intervals of exercise. General instructions Take and apply over-the-counter and prescription medicines only as told by your health care provider. If you were prescribed antibiotics, apply it or take them as told by your health care provider. Do not stop using the antibiotic even if your condition improves. If your eyelids are affected, apply warm compresses to them. Do this as told by your health care provider. Keep all follow-up visits. Contact a health care provider if: Your symptoms get worse. Your symptoms do not improve after 2 months of treatment. You have new symptoms. You have  any changes in vision or you have problems with your eyes, such as redness or itching. You feel depressed. You lose your appetite. You have trouble concentrating. Summary Rosacea is a long-term (chronic) condition that affects the skin of the face, including the cheeks, nose, forehead, and chin. Take care  of your skin as told by your health care provider. Take and apply over-the-counter and prescription medicines only as told by your health care provider. Contact a health care provider if your symptoms get worse or if you have any changes in vision or other problems with your eyes, such as redness or itching. Keep all follow-up visits. This information is not intended to replace advice given to you by your health care provider. Make sure you discuss any questions you have with your health care provider. Document Revised: 05/09/2021 Document Reviewed: 05/09/2021 Elsevier Patient Education  2024 [employer].   Preventive Care 65 Years and Older, Male Preventive care refers to lifestyle choices and visits with your health care provider that can promote health and wellness. Preventive care visits are also called wellness exams. What can I expect for my preventive care visit? Counseling During your preventive care visit, your health care provider may ask about your: Medical history, including: Past medical problems. Family medical history. History of falls. Current health, including: Emotional well-being. Home life and relationship well-being. Sexual activity. Memory and ability to understand (cognition). Lifestyle, including: Alcohol, nicotine or tobacco, and drug use. Access to firearms. Diet, exercise, and sleep habits. Work and work Astronomer. Sunscreen use. Safety issues such as seatbelt and bike helmet use. Physical exam Your health care provider will check your: Height and weight. These may be used to calculate your BMI (body mass index). BMI is a measurement that tells if you are at a healthy weight. Waist circumference. This measures the distance around your waistline. This measurement also tells if you are at a healthy weight and may help predict your risk of certain diseases, such as type 2 diabetes and high blood pressure. Heart rate and blood pressure. Body  temperature. Skin for abnormal spots. What immunizations do I need?  Vaccines are usually given at various ages, according to a schedule. Your health care provider will recommend vaccines for you based on your age, medical history, and lifestyle or other factors, such as travel or where you work. What tests do I need? Screening Your health care provider may recommend screening tests for certain conditions. This may include: Lipid and cholesterol levels. Diabetes screening. This is done by checking your blood sugar (glucose) after you have not eaten for a while (fasting). Hepatitis C test. Hepatitis B test. HIV (human immunodeficiency virus) test. STI (sexually transmitted infection) testing, if you are at risk. Lung cancer screening. Colorectal cancer screening. Prostate cancer screening. Abdominal aortic aneurysm (AAA) screening. You may need this if you are a current or former smoker. Talk with your health care provider about your test results, treatment options, and if necessary, the need for more tests. Follow these instructions at home: Eating and drinking  Eat a diet that includes fresh fruits and vegetables, whole grains, lean protein, and low-fat dairy products. Limit your intake of foods with high amounts of sugar, saturated fats, and salt. Take vitamin and mineral supplements as recommended by your health care provider. Do not drink alcohol if your health care provider tells you not to drink. If you drink alcohol: Limit how much you have to 0-2 drinks a day. Know how much alcohol is in your  drink. In the [place] one drink equals one 12 oz bottle of beer (355 mL), one 5 oz glass of wine (148 mL), or one 1 oz glass of hard liquor (44 mL). Lifestyle Brush your teeth every morning and night with fluoride toothpaste. Floss one time each day. Exercise for at least 30 minutes 5 or more days each week. Do not use any products that contain nicotine or tobacco. These products include  cigarettes, chewing tobacco, and vaping devices, such as e-cigarettes. If you need help quitting, ask your health care provider. Do not use drugs. If you are sexually active, practice safe sex. Use a condom or other form of protection to prevent STIs. Take aspirin only as told by your health care provider. Make sure that you understand how much to take and what form to take. Work with your health care provider to find out whether it is safe and beneficial for you to take aspirin daily. Ask your health care provider if you need to take a cholesterol-lowering medicine (statin). Find healthy ways to manage stress, such as: Meditation, yoga, or listening to music. Journaling. Talking to a trusted person. Spending time with friends and family. Safety Always wear your seat belt while driving or riding in a vehicle. Do not drive: If you have been drinking alcohol. Do not ride with someone who has been drinking. When you are tired or distracted. While texting. If you have been using any mind-altering substances or drugs. Wear a helmet and other protective equipment during sports activities. If you have firearms in your house, make sure you follow all gun safety procedures. Minimize exposure to UV radiation to reduce your risk of skin cancer. What's next? Visit your health care provider once a year for an annual wellness visit. Ask your health care provider how often you should have your eyes and teeth checked. Stay up to date on all vaccines. This information is not intended to replace advice given to you by your health care provider. Make sure you discuss any questions you have with your health care provider. Document Revised: 09/13/2020 Document Reviewed: 09/13/2020 Elsevier Patient Education  2024 [employer].

## 2022-10-15 ENCOUNTER — Other Ambulatory Visit: Payer: Self-pay

## 2022-10-15 DIAGNOSIS — R21 Rash and other nonspecific skin eruption: Secondary | ICD-10-CM

## 2022-10-15 LAB — COMPREHENSIVE METABOLIC PANEL
ALT: 24 U/L (ref 0–53)
AST: 38 U/L — ABNORMAL HIGH (ref 0–37)
Albumin: 4.3 g/dL (ref 3.5–5.2)
Alkaline Phosphatase: 52 U/L (ref 39–117)
BUN: 18 mg/dL (ref 6–23)
CO2: 26 mEq/L (ref 19–32)
Calcium: 9.2 mg/dL (ref 8.4–10.5)
Chloride: 107 mEq/L (ref 96–112)
Creatinine, Ser: 1.12 mg/dL (ref 0.40–1.50)
GFR: 62.93 mL/min (ref >60.00)
Glucose, Bld: 86 mg/dL (ref 70–99)
Potassium: 4.4 mEq/L (ref 3.5–5.1)
Sodium: 138 mEq/L (ref 135–145)
Total Bilirubin: 0.7 mg/dL (ref 0.2–1.2)
Total Protein: 6.8 g/dL (ref 6.0–8.3)

## 2022-10-15 LAB — LIPID PANEL
Cholesterol: 140 mg/dL (ref 0–200)
HDL: 49.3 mg/dL (ref >39.00)
LDL Cholesterol: 75 mg/dL (ref 0–99)
NonHDL: 90.91
Total CHOL/HDL Ratio: 3
Triglycerides: 82 mg/dL (ref 0.0–149.0)
VLDL: 16.4 mg/dL (ref 0.0–40.0)

## 2022-10-15 LAB — HEMOGLOBIN A1C: Hgb A1c MFr Bld: 5.8 % (ref 4.6–6.5)

## 2022-10-15 MED ORDER — METRONIDAZOLE 1 % EX GEL: CUTANEOUS | 2 refills | Status: DC

## 2022-12-16 IMAGING — DX DG CHEST 2V
2 series · 2 of 2 positions shown · non-contrast
Comparison: None.

CLINICAL DATA: Preop left hip replacement 05/02/2021.

EXAM:
CHEST - 2 VIEW

[chest pa]
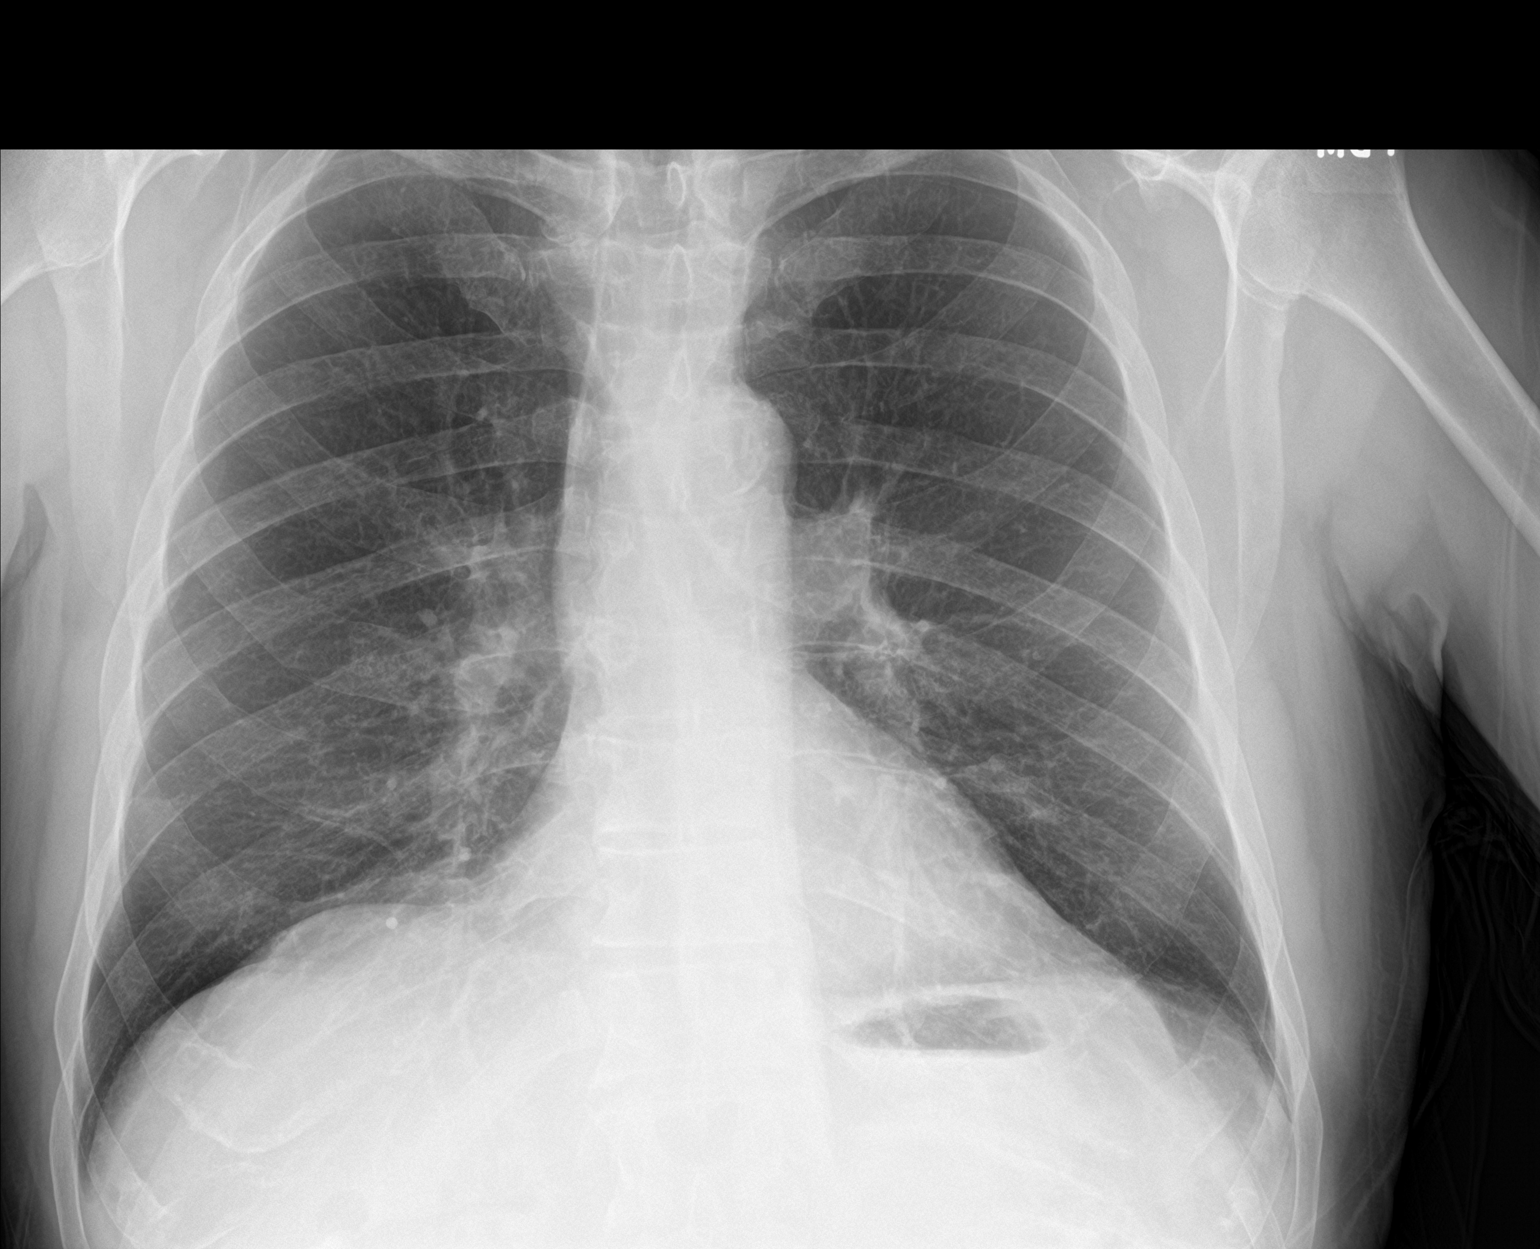

[chest lat]
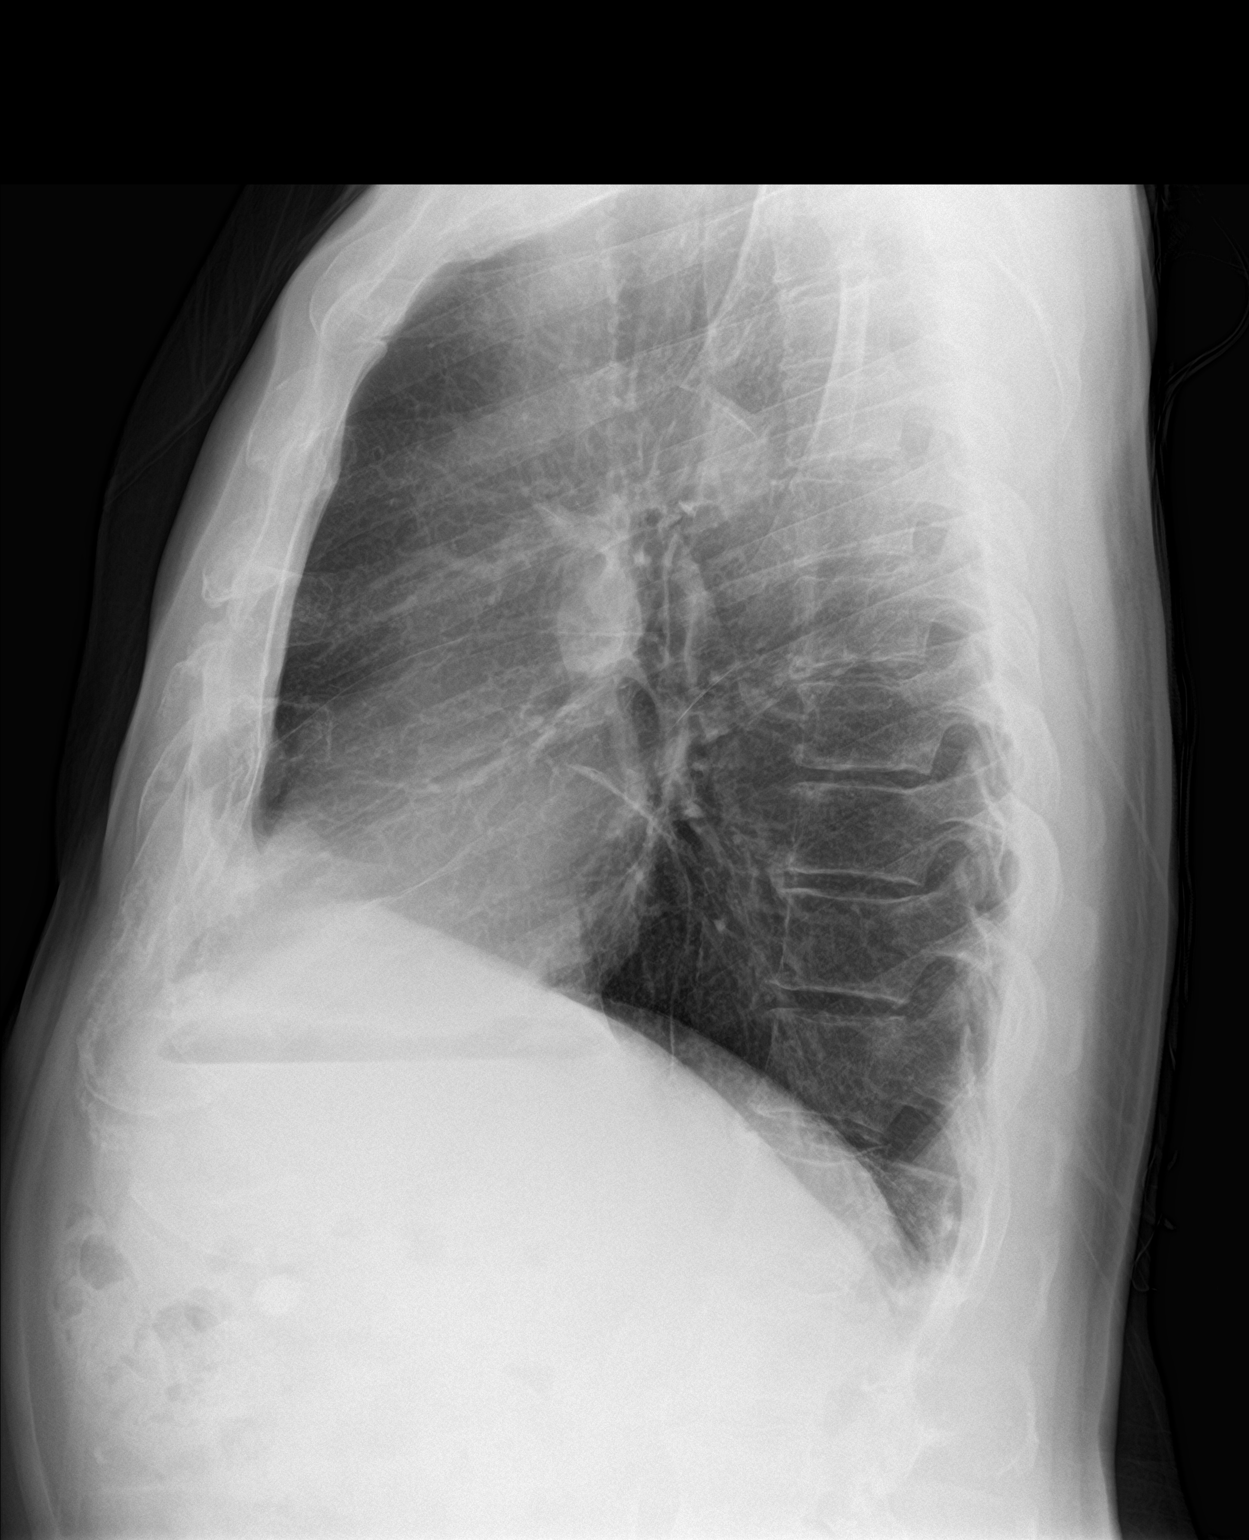

[2 of 2 positions shown; findings below may reference images not displayed]

FINDINGS: The cardiomediastinal contours are normal. Minor subsegmental linear
atelectasis or scar in the right middle lobe. Pulmonary vasculature
is normal. No consolidation, pleural effusion, or pneumothorax. No
acute osseous abnormalities are seen.
IMPRESSION: Minor subsegmental linear atelectasis or scar in the right middle
lobe. Otherwise negative.

## 2022-12-28 IMAGING — RF DG HIP (WITH PELVIS) OPERATIVE*L*
1 series · 14 of 14 positions shown · non-contrast
Comparison: None.

CLINICAL DATA: Hip replacement

EXAM:
OPERATIVE left HIP (WITH PELVIS IF PERFORMED) 14 VIEWS
TECHNIQUE: Fluoroscopic spot image(s) were submitted for interpretation
post-operatively.

[Series 1: unknown protocol · 0.20mm/px · 14 of 14 slices shown]
[im 1/14]
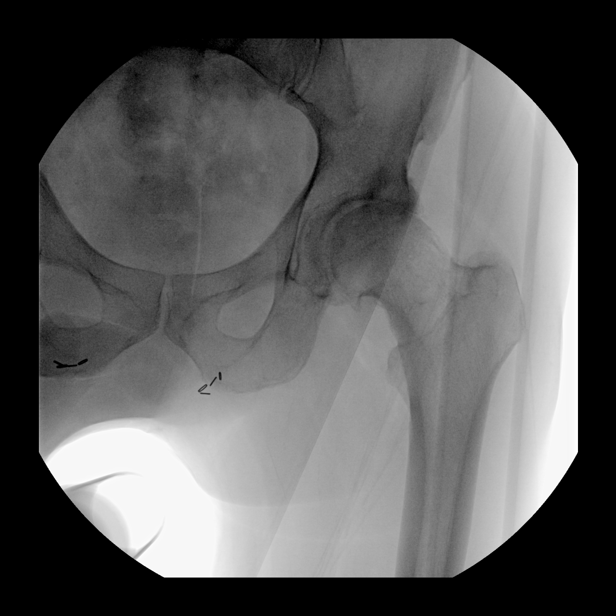
[im 2/14]
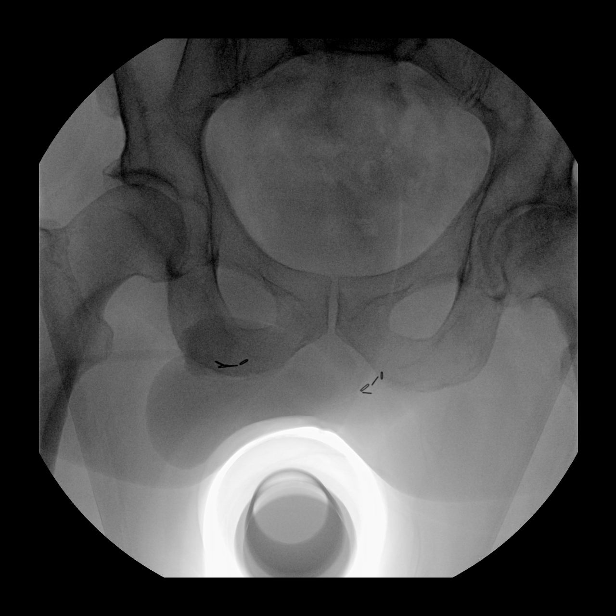
[im 3/14]
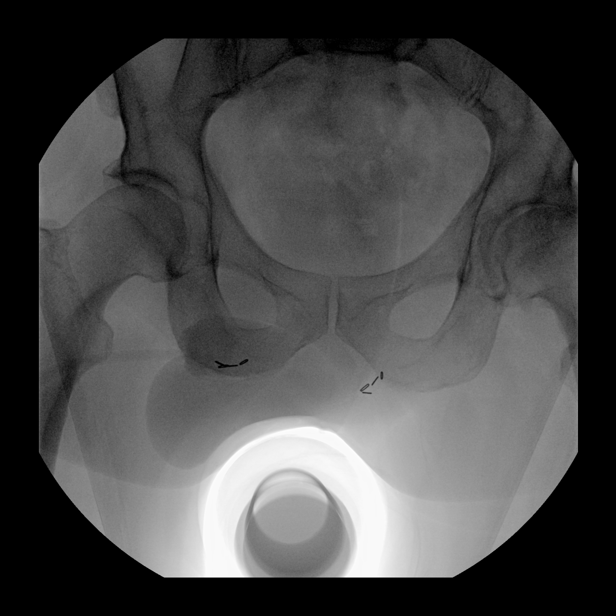
[im 4/14]
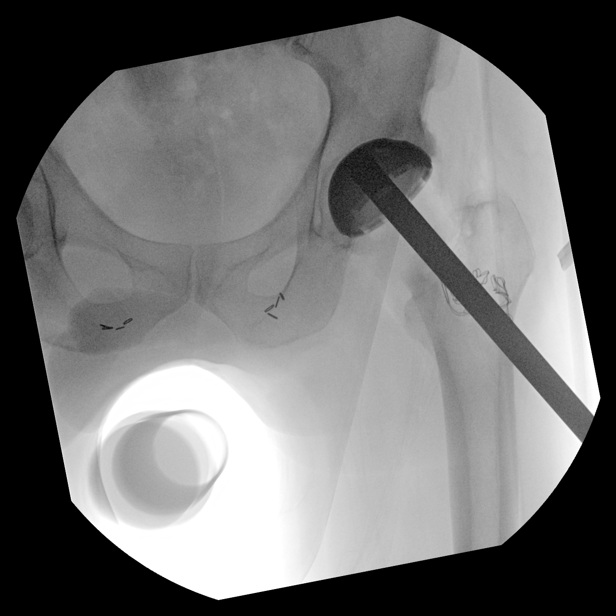
[im 5/14]
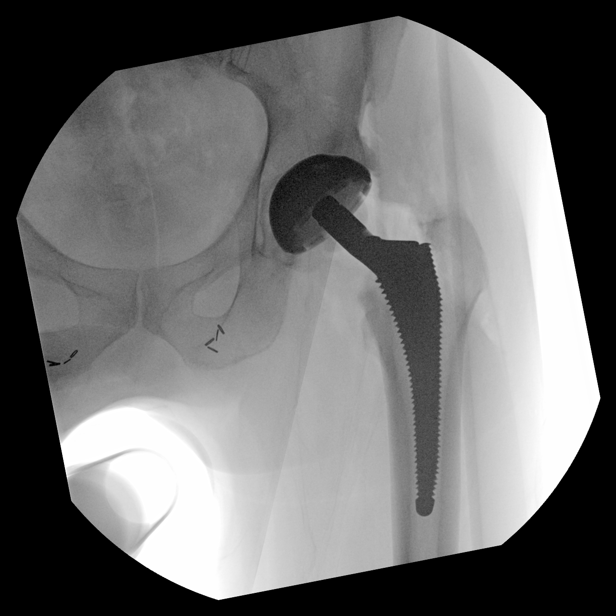
[im 6/14]
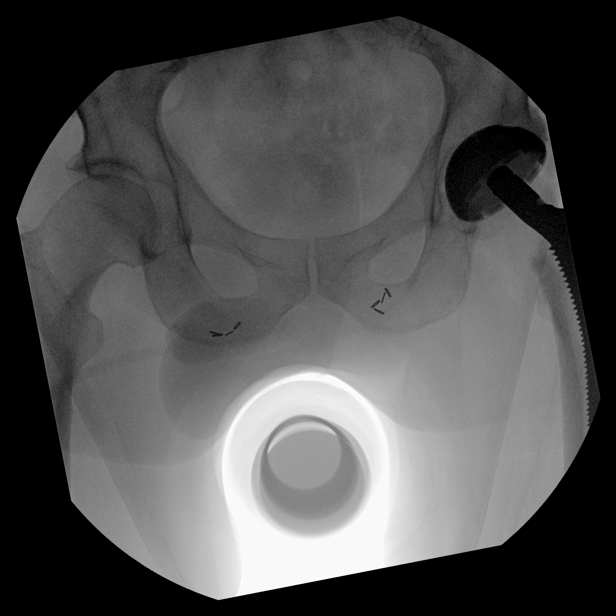
[im 7/14]
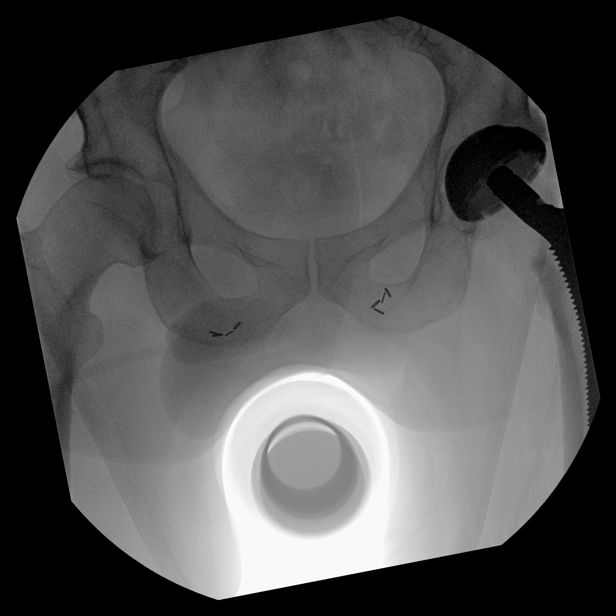
[im 8/14]
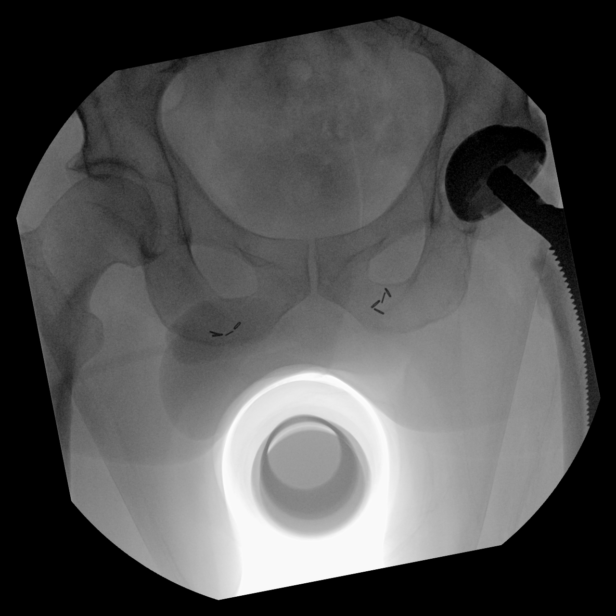
[im 9/14]
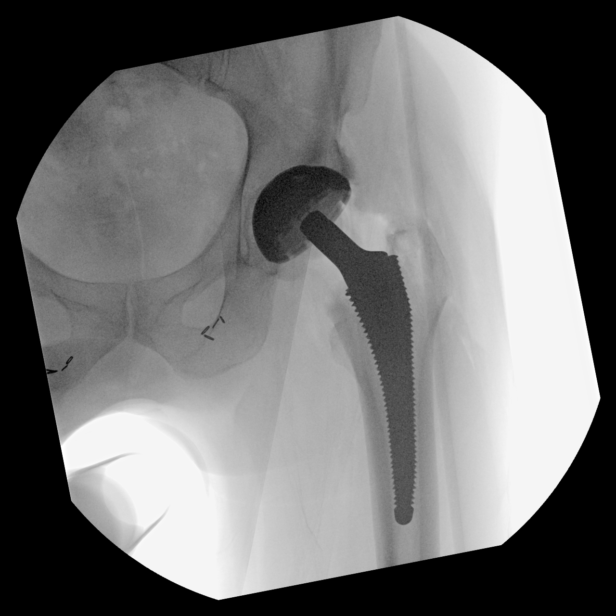
[im 10/14]
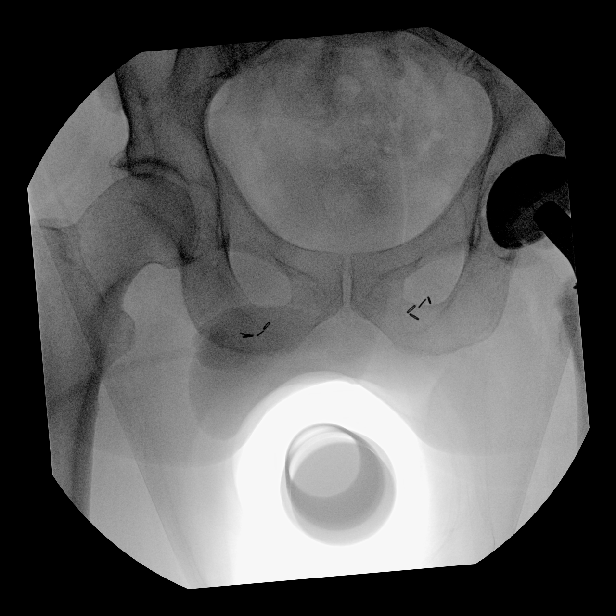
[im 11/14]
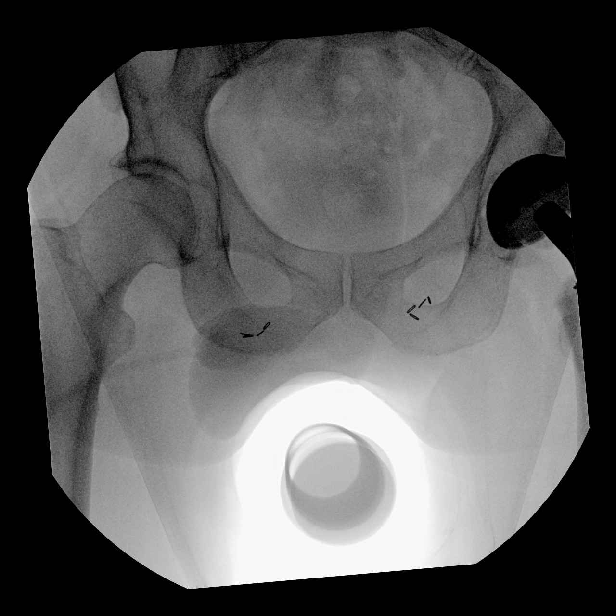
[im 12/14]
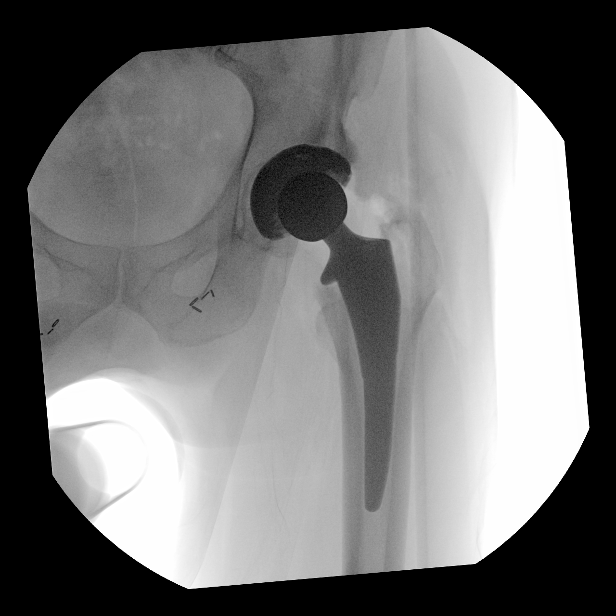
[im 13/14]
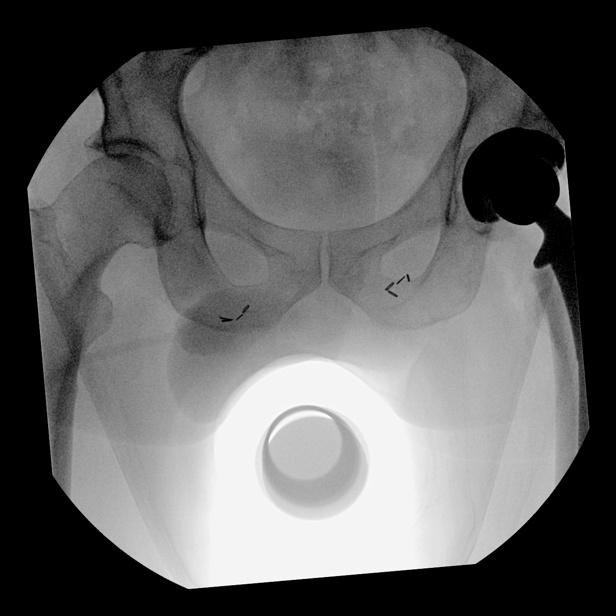
[im 14/14]
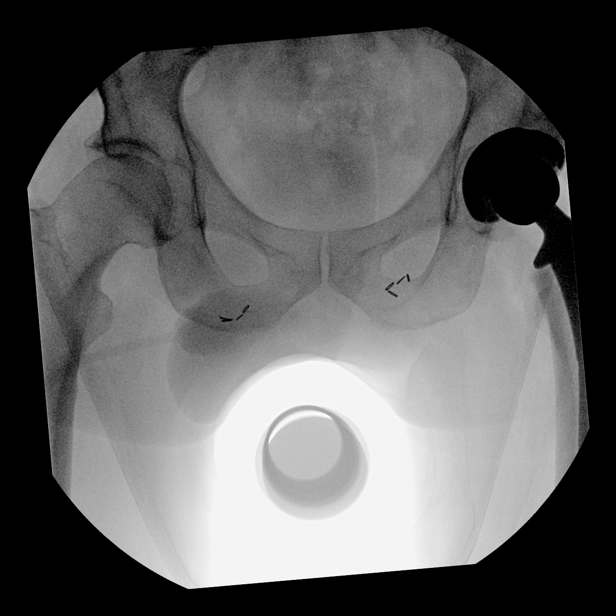

[14 of 14 positions shown; findings below may reference images not displayed]

FINDINGS: Intraoperative images during left hip arthroplasty. Normal alignment
without evidence of immediate hardware complication.
IMPRESSION: Intraoperative images during left hip arthroplasty. Normal alignment
without evidence of immediate hardware complication.

## 2023-01-08 DIAGNOSIS — M25562 Pain in left knee: Secondary | ICD-10-CM | POA: Diagnosis not present

## 2023-04-01 DIAGNOSIS — L821 Other seborrheic keratosis: Secondary | ICD-10-CM | POA: Diagnosis not present

## 2023-04-01 DIAGNOSIS — L718 Other rosacea: Secondary | ICD-10-CM | POA: Diagnosis not present

## 2023-04-01 DIAGNOSIS — L820 Inflamed seborrheic keratosis: Secondary | ICD-10-CM | POA: Diagnosis not present

## 2023-07-30 DIAGNOSIS — H01004 Unspecified blepharitis left upper eyelid: Secondary | ICD-10-CM | POA: Diagnosis not present

## 2023-07-30 DIAGNOSIS — H0100A Unspecified blepharitis right eye, upper and lower eyelids: Secondary | ICD-10-CM | POA: Diagnosis not present

## 2023-07-30 DIAGNOSIS — H5202 Hypermetropia, left eye: Secondary | ICD-10-CM | POA: Diagnosis not present

## 2023-07-30 DIAGNOSIS — H524 Presbyopia: Secondary | ICD-10-CM | POA: Diagnosis not present

## 2023-07-30 DIAGNOSIS — H52203 Unspecified astigmatism, bilateral: Secondary | ICD-10-CM | POA: Diagnosis not present

## 2023-07-30 DIAGNOSIS — H2512 Age-related nuclear cataract, left eye: Secondary | ICD-10-CM | POA: Diagnosis not present

## 2023-07-30 DIAGNOSIS — H43813 Vitreous degeneration, bilateral: Secondary | ICD-10-CM | POA: Diagnosis not present

## 2023-07-30 DIAGNOSIS — H26491 Other secondary cataract, right eye: Secondary | ICD-10-CM | POA: Diagnosis not present

## 2023-09-09 ENCOUNTER — Encounter (HOSPITAL_BASED_OUTPATIENT_CLINIC_OR_DEPARTMENT_OTHER): Payer: Self-pay

## 2023-10-14 ENCOUNTER — Ambulatory Visit (INDEPENDENT_AMBULATORY_CARE_PROVIDER_SITE_OTHER): Admitting: *Deleted

## 2023-10-14 VITALS — Ht 71.0 in | Wt 170.0 lb

## 2023-10-14 DIAGNOSIS — Z0000 Encounter for general adult medical examination without abnormal findings: Secondary | ICD-10-CM

## 2023-10-14 NOTE — Patient Instructions (Signed)
 Jesse Riddle , Thank you for taking time to come for your Medicare Wellness Visit. I appreciate your ongoing commitment to your health goals. Please review the following plan we discussed and let me know if I can assist you in the future.   Screening recommendations/referrals: Colonoscopy: no longer required Recommended yearly ophthalmology/optometry visit for glaucoma screening and checkup Recommended yearly dental visit for hygiene and checkup  Vaccinations: Influenza vaccine: up to date Pneumococcal vaccine: up to date Tdap vaccine: education provided Shingles vaccine: up to date       Preventive Care 65 Years and Older, Male Preventive care refers to lifestyle choices and visits with your health care provider that can promote health and wellness. What does preventive care include? A yearly physical exam. This is also called an annual well check. Dental exams once or twice a year. Routine eye exams. Ask your health care provider how often you should have your eyes checked. Personal lifestyle choices, including: Daily care of your teeth and gums. Regular physical activity. Eating a healthy diet. Avoiding tobacco and drug use. Limiting alcohol use. Practicing safe sex. Taking low doses of aspirin  every day. Taking vitamin and mineral supplements as recommended by your health care provider. What happens during an annual well check? The services and screenings done by your health care provider during your annual well check will depend on your age, overall health, lifestyle risk factors, and family history of disease. Counseling  Your health care provider may ask you questions about your: Alcohol use. Tobacco use. Drug use. Emotional well-being. Home and relationship well-being. Sexual activity. Eating habits. History of falls. Memory and ability to understand (cognition). Work and work Astronomer. Screening  You may have the following tests or measurements: Height,  weight, and BMI. Blood pressure. Lipid and cholesterol levels. These may be checked every 5 years, or more frequently if you are over 53 years old. Skin check. Lung cancer screening. You may have this screening every year starting at age 67 if you have a 30-pack-year history of smoking and currently smoke or have quit within the past 15 years. Fecal occult blood test (FOBT) of the stool. You may have this test every year starting at age 35. Flexible sigmoidoscopy or colonoscopy. You may have a sigmoidoscopy every 5 years or a colonoscopy every 10 years starting at age 68. Prostate cancer screening. Recommendations will vary depending on your family history and other risks. Hepatitis C blood test. Hepatitis B blood test. Sexually transmitted disease (STD) testing. Diabetes screening. This is done by checking your blood sugar (glucose) after you have not eaten for a while (fasting). You may have this done every 1-3 years. Abdominal aortic aneurysm (AAA) screening. You may need this if you are a current or former smoker. Osteoporosis. You may be screened starting at age 7 if you are at high risk. Talk with your health care provider about your test results, treatment options, and if necessary, the need for more tests. Vaccines  Your health care provider may recommend certain vaccines, such as: Influenza vaccine. This is recommended every year. Tetanus, diphtheria, and acellular pertussis (Tdap, Td) vaccine. You may need a Td booster every 10 years. Zoster vaccine. You may need this after age 68. Pneumococcal 13-valent conjugate (PCV13) vaccine. One dose is recommended after age 37. Pneumococcal polysaccharide (PPSV23) vaccine. One dose is recommended after age 67. Talk to your health care provider about which screenings and vaccines you need and how often you need them. This information is not intended  to replace advice given to you by your health care provider. Make sure you discuss any  questions you have with your health care provider. Document Released: 04/14/2015 Document Revised: 12/06/2015 Document Reviewed: 01/17/2015 Elsevier Interactive Patient Education  2017 [employer].  Fall Prevention in the Home Falls can cause injuries. They can happen to people of all ages. There are many things you can do to make your home safe and to help prevent falls. What can I do on the outside of my home? Regularly fix the edges of walkways and driveways and fix any cracks. Remove anything that might make you trip as you walk through a door, such as a raised step or threshold. Trim any bushes or trees on the path to your home. Use bright outdoor lighting. Clear any walking paths of anything that might make someone trip, such as rocks or tools. Regularly check to see if handrails are loose or broken. Make sure that both sides of any steps have handrails. Any raised decks and porches should have guardrails on the edges. Have any leaves, snow, or ice cleared regularly. Use sand or salt on walking paths during winter. Clean up any spills in your garage right away. This includes oil or grease spills. What can I do in the bathroom? Use night lights. Install grab bars by the toilet and in the tub and shower. Do not use towel bars as grab bars. Use non-skid mats or decals in the tub or shower. If you need to sit down in the shower, use a plastic, non-slip stool. Keep the floor dry. Clean up any water  that spills on the floor as soon as it happens. Remove soap buildup in the tub or shower regularly. Attach bath mats securely with double-sided non-slip rug tape. Do not have throw rugs and other things on the floor that can make you trip. What can I do in the bedroom? Use night lights. Make sure that you have a light by your bed that is easy to reach. Do not use any sheets or blankets that are too big for your bed. They should not hang down onto the floor. Have a firm chair that has side  arms. You can use this for support while you get dressed. Do not have throw rugs and other things on the floor that can make you trip. What can I do in the kitchen? Clean up any spills right away. Avoid walking on wet floors. Keep items that you use a lot in easy-to-reach places. If you need to reach something above you, use a strong step stool that has a grab bar. Keep electrical cords out of the way. Do not use floor polish or wax that makes floors slippery. If you must use wax, use non-skid floor wax. Do not have throw rugs and other things on the floor that can make you trip. What can I do with my stairs? Do not leave any items on the stairs. Make sure that there are handrails on both sides of the stairs and use them. Fix handrails that are broken or loose. Make sure that handrails are as long as the stairways. Check any carpeting to make sure that it is firmly attached to the stairs. Fix any carpet that is loose or worn. Avoid having throw rugs at the top or bottom of the stairs. If you do have throw rugs, attach them to the floor with carpet tape. Make sure that you have a light switch at the top of the stairs and  the bottom of the stairs. If you do not have them, ask someone to add them for you. What else can I do to help prevent falls? Wear shoes that: Do not have high heels. Have rubber bottoms. Are comfortable and fit you well. Are closed at the toe. Do not wear sandals. If you use a stepladder: Make sure that it is fully opened. Do not climb a closed stepladder. Make sure that both sides of the stepladder are locked into place. Ask someone to hold it for you, if possible. Clearly mark and make sure that you can see: Any grab bars or handrails. First and last steps. Where the edge of each step is. Use tools that help you move around (mobility aids) if they are needed. These include: Canes. Walkers. Scooters. Crutches. Turn on the lights when you go into a dark area.  Replace any light bulbs as soon as they burn out. Set up your furniture so you have a clear path. Avoid moving your furniture around. If any of your floors are uneven, fix them. If there are any pets around you, be aware of where they are. Review your medicines with your doctor. Some medicines can make you feel dizzy. This can increase your chance of falling. Ask your doctor what other things that you can do to help prevent falls. This information is not intended to replace advice given to you by your health care provider. Make sure you discuss any questions you have with your health care provider. Document Released: 01/12/2009 Document Revised: 08/24/2015 Document Reviewed: 04/22/2014 Elsevier Interactive Patient Education  2017 [employer].

## 2023-10-14 NOTE — Progress Notes (Signed)
 Subjective:   Jesse Riddle is a 78 y.o. male who presents for Medicare Annual/Subsequent preventive examination.  Visit Complete: Virtual I connected with  Jesse Riddle on 10/14/23 by a audio enabled telemedicine application and verified that I am speaking with the correct person using two identifiers.  Patient Location: Home  Provider Location: Home Office  I discussed the limitations of evaluation and management by telemedicine. The patient expressed understanding and agreed to proceed.  Vital Signs: Because this visit was a virtual/telehealth visit, some criteria may be missing or patient reported. Any vitals not documented were not able to be obtained and vitals that have been documented are patient reported.  Patient Medicare AWV questionnaire was completed by the patient on 10-13-2023; I have confirmed that all information answered by patient is correct and no changes since this date.  Cardiac Risk Factors include: advanced age (>42men, >78 women);male gender     Objective:    Today's Vitals   10/14/23 1135  Weight: 170 lb (77.1 kg)  Height: 5' 11 (1.803 m)   Body mass index is 23.71 kg/m.     10/14/2023   11:33 AM 08/01/2022    8:06 AM 05/02/2021    1:00 PM 05/02/2021    8:25 AM 04/25/2021   11:13 AM 05/12/2019   10:43 AM 04/13/2018    1:25 PM  Advanced Directives  Does Patient Have a Medical Advance Directive? Yes Yes Yes Yes Yes Yes Yes   Type of Estate agent of [employer] Power of [place];Living will Healthcare Power of [place];Living will Healthcare Power of [place];Living will Healthcare Power of [place];Living will Living will Living will;Healthcare Power of Attorney  Does patient want to make changes to medical advance directive?   No - Patient declined   No - Patient declined No - Patient declined   Copy of Healthcare Power of Attorney in Chart?  No - copy requested No - copy requested No - copy requested No - copy requested        Data saved with a previous flowsheet row definition    Current Medications (verified) Outpatient Encounter Medications as of 10/14/2023  Medication Sig   atorvastatin  (LIPITOR) 10 MG tablet Take 1 tablet by mouth once daily at 6pm   Coenzyme Q10 (CO Q-10 PO) Take 1 capsule by mouth daily.   fish oil-omega-3 fatty acids 1000 MG capsule Take 1 g by mouth daily.   metroNIDAZOLE  (METROGEL ) 1 % gel Apply topically to effected facial skin daily, approximately a dime sized amount for coverage   Turmeric (QC TUMERIC COMPLEX PO) Take 1 capsule by mouth daily.   No facility-administered encounter medications on file as of 10/14/2023.    Allergies (verified) Penicillins   History: Past Medical History:  Diagnosis Date   Allergy    penicillin   History of kidney stones    Past Surgical History:  Procedure Laterality Date   COLONOSCOPY     FINGER SURGERY     TONSILLECTOMY     TOTAL HIP ARTHROPLASTY Left 05/02/2021   Procedure: TOTAL HIP ARTHROPLASTY ANTERIOR APPROACH;  Surgeon: Melodi Lerner, MD;  Location: [facility];  Service: Orthopedics;  Laterality: Left;   Family History  Problem Relation Age of Onset   Stroke Mother    Mental illness Father    Heart disease Brother    Heart disease Brother    Social History   Socioeconomic History   Marital status: Married    Spouse name: Not on file  Number of children: 3   Years of education: Not on file   Highest education level: Bachelor's degree (e.g., BA, AB, BS)  Occupational History   Occupation: Retired    Associate Professor: Sales promotion account executive  Tobacco Use   Smoking status: Never   Smokeless tobacco: Never  Vaping Use   Vaping status: Never Used  Substance and Sexual Activity   Alcohol use: Yes    Alcohol/week: 1.0 standard drink of alcohol    Types: 1 Cans of beer per week    Comment: Occasional   Drug use: No   Sexual activity: Yes  Other Topics Concern   Not on file  Social History Narrative   Married. Education: [employer].  Exercise: walk/bike 3 times a week for 30 minutes.   Social Drivers of Corporate investment banker Strain: Low Risk  (10/14/2023)   Overall Financial Resource Strain (CARDIA)    Difficulty of Paying Living Expenses: Not hard at all  Food Insecurity: No Food Insecurity (10/14/2023)   Hunger Vital Sign    Worried About Running Out of Food in the Last Year: Never true    Ran Out of Food in the Last Year: Never true  Transportation Needs: No Transportation Needs (10/14/2023)   PRAPARE - Administrator, Civil Service (Medical): No    Lack of Transportation (Non-Medical): No  Physical Activity: Sufficiently Active (10/14/2023)   Exercise Vital Sign    Days of Exercise per Week: 4 days    Minutes of Exercise per Session: 40 min  Stress: No Stress Concern Present (10/14/2023)   [employer] of Occupational Health - Occupational Stress Questionnaire    Feeling of Stress: Not at all  Social Connections: Socially Integrated (10/14/2023)   Social Connection and Isolation Panel    Frequency of Communication with Friends and Family: More than three times a week    Frequency of Social Gatherings with Friends and Family: Three times a week    Attends Religious Services: More than 4 times per year    Active Member of Clubs or Organizations: Yes    Attends Engineer, structural: More than 4 times per year    Marital Status: Married    Tobacco Counseling Counseling given: Not Answered   Clinical Intake:  Pre-visit preparation completed: Yes  Pain : No/denies pain     Diabetes: No  How often do you need to have someone help you when you read instructions, pamphlets, or other written materials from your doctor or pharmacy?: 1 - Never  Interpreter Needed?: No  Information entered by :: Mliss Graff LPN   Activities of Daily Living    10/14/2023   11:35 AM 10/13/2023   10:19 AM  In your present state of health, do you have any difficulty performing the following  activities:  Hearing? 0 0  Vision? 0 0  Difficulty concentrating or making decisions? 0 0  Walking or climbing stairs? 0 0  Dressing or bathing? 0 0  Doing errands, shopping? 0 0  Preparing Food and eating ? N N  Using the Toilet? N N  In the past six months, have you accidently leaked urine? N N  Do you have problems with loss of bowel control? N N  Managing your Medications? N N  Managing your Finances? N N  Housekeeping or managing your Housekeeping? N N    Patient Care Team: Levora Reyes SAUNDERS, MD as PCP - General (Family Medicine) Gorge Ade, MD as Consulting Physician (Obstetrics and Gynecology)  Indicate any recent Medical Services you may have received from other than [facility] providers in the past year (date may be approximate).     Assessment:   This is a routine wellness examination for Jesse Riddle.  Hearing/Vision screen Hearing Screening - Comments:: No trouble hearing Vision Screening - Comments:: [facility] Up to date   Goals Addressed             This Visit's Progress    Patient Stated       Stay healthy       Depression Screen    10/14/2023   11:36 AM 08/01/2022    8:12 AM 10/08/2021    8:08 AM 04/16/2021    8:09 AM 09/28/2020    2:33 PM 05/12/2019    9:58 AM 04/13/2018    1:41 PM  PHQ 2/9 Scores  PHQ - 2 Score 0 0 0 0 0 0 0  PHQ- 9 Score 0 0  0       Fall Risk    10/14/2023   11:34 AM 10/13/2023   10:19 AM 08/01/2022    8:05 AM 10/08/2021    8:08 AM 04/16/2021    8:08 AM  Fall Risk   Falls in the past year? 0 0 0 0 0  Number falls in past yr: 0 0 0 0 0  Injury with Fall? 0 0 0 0 0  Risk for fall due to :    No Fall Risks No Fall Risks  Follow up Falls evaluation completed;Education provided;Falls prevention discussed  Falls evaluation completed;Education provided;Falls prevention discussed Falls evaluation completed  Falls evaluation completed      Data saved with a previous flowsheet row definition    MEDICARE RISK AT HOME: Medicare Risk at  Home Any stairs in or around the home?: (Patient-Rptd) Yes If so, are there any without handrails?: (Patient-Rptd) Yes Home free of loose throw rugs in walkways, pet beds, electrical cords, etc?: (Patient-Rptd) Yes Adequate lighting in your home to reduce risk of falls?: (Patient-Rptd) Yes Life alert?: (Patient-Rptd) No Use of a cane, walker or w/c?: (Patient-Rptd) No Grab bars in the bathroom?: (Patient-Rptd) No Shower chair or bench in shower?: (Patient-Rptd) No Elevated toilet seat or a handicapped toilet?: (Patient-Rptd) No  TIMED UP AND GO:  Was the test performed?  No    Cognitive Function:        10/14/2023   11:34 AM 08/01/2022    8:09 AM 09/28/2020    2:33 PM 05/12/2019   10:04 AM 04/13/2018    1:42 PM  6CIT Screen  What Year? 0 points 0 points 0 points 0 points 0 points  What month? 0 points 0 points 0 points 0 points 0 points  What time? 0 points 0 points 0 points 0 points 0 points  Count back from 20 0 points 0 points 0 points 0 points 0 points  Months in reverse 0 points 0 points 0 points 0 points 0 points  Repeat phrase 0 points 0 points 0 points 2 points 0 points  Total Score 0 points 0 points 0 points 2 points 0 points    Immunizations Immunization History  Administered Date(s) Administered   Influenza,inj,Quad PF,6+ Mos 12/27/2013, 11/25/2016   Influenza-Unspecified 12/31/2014, 01/08/2016, 12/21/2020   PFIZER(Purple Top)SARS-COV-2 Vaccination 04/21/2019, 05/14/2019   Pneumococcal Conjugate-13 12/27/2013   Pneumococcal Polysaccharide-23 11/22/2011   Tdap 11/22/2011   Zoster, Live 02/27/2013    TDAP status: Due, Education has been provided regarding the importance of this vaccine. Advised may receive  this vaccine at local pharmacy or Health Dept. Aware to provide a copy of the vaccination record if obtained from local pharmacy or Health Dept. Verbalized acceptance and understanding.  Flu Vaccine status: Up to date  Pneumococcal vaccine status: Up to  date  Covid-19 vaccine status: Information provided on how to obtain vaccines.   Qualifies for Shingles Vaccine? No   Zostavax completed Yes   Shingrix  Completed?: No.    Education has been provided regarding the importance of this vaccine. Patient has been advised to call insurance company to determine out of pocket expense if they have not yet received this vaccine. Advised may also receive vaccine at local pharmacy or Health Dept. Verbalized acceptance and understanding.  Screening Tests Health Maintenance  Topic Date Due   Zoster Vaccines- Shingrix  (1 of 2) 04/18/1994   DTaP/Tdap/Td (2 - Td or Tdap) 11/21/2021   COVID-19 Vaccine (3 - 2024-25 season) 12/01/2022   INFLUENZA VACCINE  10/31/2023   Medicare Annual Wellness (AWV)  10/13/2024   Pneumococcal Vaccine: 50+ Years  Completed   Hepatitis C Screening  Completed   Hepatitis B Vaccines  Aged Out   HPV VACCINES  Aged Out   Meningococcal B Vaccine  Aged Out   Colonoscopy  Discontinued    Health Maintenance  Health Maintenance Due  Topic Date Due   Zoster Vaccines- Shingrix  (1 of 2) 04/18/1994   DTaP/Tdap/Td (2 - Td or Tdap) 11/21/2021   COVID-19 Vaccine (3 - 2024-25 season) 12/01/2022    Colorectal cancer screening: No longer required.   Lung Cancer Screening: (Low Dose CT Chest recommended if Age 70-80 years, 20 pack-year currently smoking OR have quit w/in 15years.) does not qualify.   Lung Cancer Screening Referral:   Additional Screening:  Hepatitis C Screening: does not qualify; Completed 2017  Vision Screening: Recommended annual ophthalmology exams for early detection of glaucoma and other disorders of the eye. Is the patient up to date with their annual eye exam?  Yes  Who is the provider or what is the name of the office in which the patient attends annual eye exams? [facility] If pt is not established with a provider, would they like to be referred to a provider to establish care? No .   Dental Screening:  Recommended annual dental exams for proper oral hygiene    Community Resource Referral / Chronic Care Management: CRR required this visit?  No   CCM required this visit?  No     Plan:     I have personally reviewed and noted the following in the patient's chart:   Medical and social history Use of alcohol, tobacco or illicit drugs  Current medications and supplements including opioid prescriptions. Patient is not currently taking opioid prescriptions. Functional ability and status Nutritional status Physical activity Advanced directives List of other physicians Hospitalizations, surgeries, and ER visits in previous 12 months Vitals Screenings to include cognitive, depression, and falls Referrals and appointments  In addition, I have reviewed and discussed with patient certain preventive protocols, quality metrics, and best practice recommendations. A written personalized care plan for preventive services as well as general preventive health recommendations were provided to patient.     Mliss Graff, LPN   [id number]/84/[id number]   After Visit Summary: (MyChart) Due to this being a telephonic visit, the after visit summary with patients personalized plan was offered to patient via MyChart   Nurse Notes:

## 2023-10-20 ENCOUNTER — Ambulatory Visit (INDEPENDENT_AMBULATORY_CARE_PROVIDER_SITE_OTHER): Payer: PPO | Admitting: Family Medicine

## 2023-10-20 ENCOUNTER — Encounter: Payer: Self-pay | Admitting: Family Medicine

## 2023-10-20 VITALS — BP 136/70 | HR 64 | Temp 98.5°F | Ht 71.0 in | Wt 170.0 lb

## 2023-10-20 DIAGNOSIS — R21 Rash and other nonspecific skin eruption: Secondary | ICD-10-CM

## 2023-10-20 DIAGNOSIS — R7309 Other abnormal glucose: Secondary | ICD-10-CM | POA: Diagnosis not present

## 2023-10-20 DIAGNOSIS — Z0000 Encounter for general adult medical examination without abnormal findings: Secondary | ICD-10-CM | POA: Diagnosis not present

## 2023-10-20 DIAGNOSIS — E785 Hyperlipidemia, unspecified: Secondary | ICD-10-CM | POA: Diagnosis not present

## 2023-10-20 DIAGNOSIS — Z7982 Long term (current) use of aspirin: Secondary | ICD-10-CM

## 2023-10-20 LAB — LIPID PANEL
Cholesterol: 151 mg/dL (ref 0–200)
HDL: 51.3 mg/dL (ref >39.00)
LDL Cholesterol: 83 mg/dL (ref 0–99)
NonHDL: 99.45
Total CHOL/HDL Ratio: 3
Triglycerides: 81 mg/dL (ref 0.0–149.0)
VLDL: 16.2 mg/dL (ref 0.0–40.0)

## 2023-10-20 LAB — COMPREHENSIVE METABOLIC PANEL WITH GFR
ALT: 19 U/L (ref 0–53)
AST: 32 U/L (ref 0–37)
Albumin: 4.4 g/dL (ref 3.5–5.2)
Alkaline Phosphatase: 55 U/L (ref 39–117)
BUN: 13 mg/dL (ref 6–23)
CO2: 27 meq/L (ref 19–32)
Calcium: 9.4 mg/dL (ref 8.4–10.5)
Chloride: 105 meq/L (ref 96–112)
Creatinine, Ser: 0.93 mg/dL (ref 0.40–1.50)
GFR: 78.1 mL/min (ref >60.00)
Glucose, Bld: 85 mg/dL (ref 70–99)
Potassium: 3.9 meq/L (ref 3.5–5.1)
Sodium: 139 meq/L (ref 135–145)
Total Bilirubin: 1 mg/dL (ref 0.2–1.2)
Total Protein: 6.9 g/dL (ref 6.0–8.3)

## 2023-10-20 LAB — CBC WITH DIFFERENTIAL/PLATELET
Basophils Absolute: 0 K/uL (ref 0.0–0.1)
Basophils Relative: 0.8 % (ref 0.0–3.0)
Eosinophils Absolute: 0.2 K/uL (ref 0.0–0.7)
Eosinophils Relative: 2.5 % (ref 0.0–5.0)
HCT: 52.9 % — ABNORMAL HIGH (ref 39.0–52.0)
Hemoglobin: 17.7 g/dL — ABNORMAL HIGH (ref 13.0–17.0)
Lymphocytes Relative: 32.3 % (ref 12.0–46.0)
Lymphs Abs: 2 K/uL (ref 0.7–4.0)
MCHC: 33.4 g/dL (ref 30.0–36.0)
MCV: 95.5 fl (ref 78.0–100.0)
Monocytes Absolute: 0.4 K/uL (ref 0.1–1.0)
Monocytes Relative: 7.1 % (ref 3.0–12.0)
Neutro Abs: 3.6 K/uL (ref 1.4–7.7)
Neutrophils Relative %: 57.3 % (ref 43.0–77.0)
Platelets: 258 K/uL (ref 150.0–400.0)
RBC: 5.54 Mil/uL (ref 4.22–5.81)
RDW: 14.1 % (ref 11.5–15.5)
WBC: 6.3 K/uL (ref 4.0–10.5)

## 2023-10-20 LAB — HEMOGLOBIN A1C: Hgb A1c MFr Bld: 5.9 % (ref 4.6–6.5)

## 2023-10-20 MED ORDER — METRONIDAZOLE 1 % EX GEL: CUTANEOUS | 2 refills | Status: AC

## 2023-10-20 MED ORDER — ATORVASTATIN CALCIUM 10 MG PO TABS: ORAL_TABLET | ORAL | 3 refills | Status: AC

## 2023-10-20 NOTE — Patient Instructions (Signed)
 Thank you for coming in today. No change in medications at this time. If there are any concerns on your bloodwork, I will let you know. Take care!  Preventive Care 23 Years and Older, Male Preventive care refers to lifestyle choices and visits with your health care provider that can promote health and wellness. Preventive care visits are also called wellness exams. What can I expect for my preventive care visit? Counseling During your preventive care visit, your health care provider may ask about your: Medical history, including: Past medical problems. Family medical history. History of falls. Current health, including: Emotional well-being. Home life and relationship well-being. Sexual activity. Memory and ability to understand (cognition). Lifestyle, including: Alcohol, nicotine or tobacco, and drug use. Access to firearms. Diet, exercise, and sleep habits. Work and work Astronomer. Sunscreen use. Safety issues such as seatbelt and bike helmet use. Physical exam Your health care provider will check your: Height and weight. These may be used to calculate your BMI (body mass index). BMI is a measurement that tells if you are at a healthy weight. Waist circumference. This measures the distance around your waistline. This measurement also tells if you are at a healthy weight and may help predict your risk of certain diseases, such as type 2 diabetes and high blood pressure. Heart rate and blood pressure. Body temperature. Skin for abnormal spots. What immunizations do I need?  Vaccines are usually given at various ages, according to a schedule. Your health care provider will recommend vaccines for you based on your age, medical history, and lifestyle or other factors, such as travel or where you work. What tests do I need? Screening Your health care provider may recommend screening tests for certain conditions. This may include: Lipid and cholesterol levels. Diabetes screening.  This is done by checking your blood sugar (glucose) after you have not eaten for a while (fasting). Hepatitis C test. Hepatitis B test. HIV (human immunodeficiency virus) test. STI (sexually transmitted infection) testing, if you are at risk. Lung cancer screening. Colorectal cancer screening. Prostate cancer screening. Abdominal aortic aneurysm (AAA) screening. You may need this if you are a current or former smoker. Talk with your health care provider about your test results, treatment options, and if necessary, the need for more tests. Follow these instructions at home: Eating and drinking  Eat a diet that includes fresh fruits and vegetables, whole grains, lean protein, and low-fat dairy products. Limit your intake of foods with high amounts of sugar, saturated fats, and salt. Take vitamin and mineral supplements as recommended by your health care provider. Do not drink alcohol if your health care provider tells you not to drink. If you drink alcohol: Limit how much you have to 0-2 drinks a day. Know how much alcohol is in your drink. In the [place] one drink equals one 12 oz bottle of beer (355 mL), one 5 oz glass of wine (148 mL), or one 1 oz glass of hard liquor (44 mL). Lifestyle Brush your teeth every morning and night with fluoride toothpaste. Floss one time each day. Exercise for at least 30 minutes 5 or more days each week. Do not use any products that contain nicotine or tobacco. These products include cigarettes, chewing tobacco, and vaping devices, such as e-cigarettes. If you need help quitting, ask your health care provider. Do not use drugs. If you are sexually active, practice safe sex. Use a condom or other form of protection to prevent STIs. Take aspirin only as told by your health  care provider. Make sure that you understand how much to take and what form to take. Work with your health care provider to find out whether it is safe and beneficial for you to take aspirin  daily. Ask your health care provider if you need to take a cholesterol-lowering medicine (statin). Find healthy ways to manage stress, such as: Meditation, yoga, or listening to music. Journaling. Talking to a trusted person. Spending time with friends and family. Safety Always wear your seat belt while driving or riding in a vehicle. Do not drive: If you have been drinking alcohol. Do not ride with someone who has been drinking. When you are tired or distracted. While texting. If you have been using any mind-altering substances or drugs. Wear a helmet and other protective equipment during sports activities. If you have firearms in your house, make sure you follow all gun safety procedures. Minimize exposure to UV radiation to reduce your risk of skin cancer. What's next? Visit your health care provider once a year for an annual wellness visit. Ask your health care provider how often you should have your eyes and teeth checked. Stay up to date on all vaccines. This information is not intended to replace advice given to you by your health care provider. Make sure you discuss any questions you have with your health care provider. Document Revised: 09/13/2020 Document Reviewed: 09/13/2020 Elsevier Patient Education  2024 [employer].

## 2023-10-20 NOTE — Progress Notes (Signed)
 Subjective:  Patient ID: Jesse Riddle, male    DOB: [birth year]  Age: 78 y.o. MRN: [id number]  CC:  Chief Complaint  Patient presents with   Annual Exam    Physical; no concerns    HPI Jesse Riddle presents for Annual Exam PCP, me Foot/ortho: Dr. Kit Ortho: Dr. Melodi, prior total hip arthroplasty in 2023. Dermatology, Dr. Toribio Molt, rosacea, seborrheic keratosis Ophthalmology, Dr. Patrcia  No health changes.   Rosacea Facial rash discussed at physical last July, suspected to be mild rosacea and treated with metronidazole  1% gel.  Option to follow-up with his dermatologist. Stable with metronidazole  gel - intermittent use working well every few months.   Prediabetes: Diet/exercise approach. Lab Results  Component Value Date   HGBA1C 5.8 10/14/2022   Wt Readings from Last 3 Encounters:  10/20/23 170 lb (77.1 kg)  10/14/23 170 lb (77.1 kg)  10/14/22 170 lb (77.1 kg)   Hyperlipidemia: With coronary calcium  scoring in June 2016 with moderate calcified coronary plaque, option of higher dose Lipitor discussed previously.  Currently treated with Lipitor 10 mg daily.  also taking aspirin  81 mg daily-risk versus benefits of aspirin  have been discussed. No myalgias or new bleeding.  Lab Results  Component Value Date   CHOL 140 10/14/2022   HDL 49.30 10/14/2022   LDLCALC 75 10/14/2022   TRIG 82.0 10/14/2022   CHOLHDL 3 10/14/2022   Lab Results  Component Value Date   ALT 24 10/14/2022   AST 38 (H) 10/14/2022   ALKPHOS 52 10/14/2022   BILITOT 0.7 10/14/2022        10/14/2023   11:36 AM 08/01/2022    8:12 AM 10/08/2021    8:08 AM 04/16/2021    8:09 AM 09/28/2020    2:33 PM  Depression screen PHQ 2/9  Decreased Interest 0 0 0 0 0  Down, Depressed, Hopeless 0 0 0 0 0  PHQ - 2 Score 0 0 0 0 0  Altered sleeping 0 0  0   Tired, decreased energy 0 0  0   Change in appetite 0 0  0   Feeling bad or failure about yourself  0 0  0   Trouble concentrating 0 0  0    Moving slowly or fidgety/restless 0 0  0   Suicidal thoughts 0 0  0   PHQ-9 Score 0 0  0   Difficult doing work/chores Not difficult at all Not difficult at all       Health Maintenance  Topic Date Due   Zoster Vaccines- Shingrix  (2 of 2) 04/28/2019   DTaP/Tdap/Td (2 - Td or Tdap) 11/21/2021   COVID-19 Vaccine (4 - 2024-25 season) 12/01/2022   INFLUENZA VACCINE  10/31/2023   Medicare Annual Wellness (AWV)  10/13/2024   Pneumococcal Vaccine: 50+ Years  Completed   Hepatitis C Screening  Completed   Hepatitis B Vaccines  Aged Out   HPV VACCINES  Aged Out   Meningococcal B Vaccine  Aged Out   Colonoscopy  Discontinued  Colonoscopy with [facility] in approximately 2022 without further testing recommended.  Immunization History  Administered Date(s) Administered   Influenza, High Dose Seasonal PF 01/04/2023   Influenza,inj,Quad PF,6+ Mos 12/27/2013, 11/25/2016   Influenza-Unspecified 12/31/2014, 01/08/2016, 12/21/2020, 02/01/2022   PFIZER(Purple Top)SARS-COV-2 Vaccination 04/21/2019, 05/14/2019, 01/07/2020   Pneumococcal Conjugate-13 12/27/2013   Pneumococcal Polysaccharide-23 11/22/2011   Tdap 11/22/2011   Zoster Recombinant(Shingrix ) 03/03/2019   Zoster, Live 02/27/2013  Shingrix  - had at pharmacy Flu  vaccine yearly.  Covid booster - declined.   No results found. Followed by ophthalmology routinely with history of cataracts, yearly visits. Recent visit - no changes  Dental: Every 6 months.  Alcohol: 1/week or less.   Tobacco: None  Exercise:Exercise in the gym 3 days per week walking and work at home, and odd jobs.    History Patient Active Problem List   Diagnosis Date Noted   OA (osteoarthritis) of hip 05/02/2021   S/P total left hip arthroplasty 05/02/2021   Past Medical History:  Diagnosis Date   Allergy    penicillin   History of kidney stones    Past Surgical History:  Procedure Laterality Date   COLONOSCOPY     FINGER SURGERY     TONSILLECTOMY      TOTAL HIP ARTHROPLASTY Left 05/02/2021   Procedure: TOTAL HIP ARTHROPLASTY ANTERIOR APPROACH;  Surgeon: Melodi Lerner, MD;  Location: [facility];  Service: Orthopedics;  Laterality: Left;   Allergies  Allergen Reactions   Penicillins Other (See Comments)    Child Tolerated Cephalosporin Date: 05/03/21.     Prior to Admission medications   Medication Sig Start Date End Date Taking? Authorizing Provider  atorvastatin  (LIPITOR) 10 MG tablet Take 1 tablet by mouth once daily at 6pm 10/14/22  Yes Levora Reyes SAUNDERS, MD  Coenzyme Q10 (CO Q-10 PO) Take 1 capsule by mouth daily.   Yes [provider]  fish oil-omega-3 fatty acids 1000 MG capsule Take 1 g by mouth daily.   Yes [provider]  metroNIDAZOLE  (METROGEL ) 1 % gel Apply topically to effected facial skin daily, approximately a dime sized amount for coverage 10/15/22  Yes Levora Reyes SAUNDERS, MD  Turmeric (QC TUMERIC COMPLEX PO) Take 1 capsule by mouth daily.   Yes [provider]   Social History   Socioeconomic History   Marital status: Married    Spouse name: Not on file   Number of children: 3   Years of education: Not on file   Highest education level: Bachelor's degree (e.g., BA, AB, BS)  Occupational History   Occupation: Retired    Associate Professor: Sales promotion account executive  Tobacco Use   Smoking status: Never   Smokeless tobacco: Never  Vaping Use   Vaping status: Never Used  Substance and Sexual Activity   Alcohol use: Yes    Alcohol/week: 1.0 standard drink of alcohol    Types: 1 Cans of beer per week    Comment: Occasional   Drug use: No   Sexual activity: Yes  Other Topics Concern   Not on file  Social History Narrative   Married. Education: [employer]. Exercise: walk/bike 3 times a week for 30 minutes.   Social Drivers of Corporate investment banker Strain: Low Risk  (10/14/2023)   Overall Financial Resource Strain (CARDIA)    Difficulty of Paying Living Expenses: Not hard at all  Food Insecurity: No  Food Insecurity (10/14/2023)   Hunger Vital Sign    Worried About Running Out of Food in the Last Year: Never true    Ran Out of Food in the Last Year: Never true  Transportation Needs: No Transportation Needs (10/14/2023)   PRAPARE - Administrator, Civil Service (Medical): No    Lack of Transportation (Non-Medical): No  Physical Activity: Sufficiently Active (10/14/2023)   Exercise Vital Sign    Days of Exercise per Week: 4 days    Minutes of Exercise per Session: 40 min  Stress: No Stress Concern Present (  10/14/2023)   [employer] of Occupational Health - Occupational Stress Questionnaire    Feeling of Stress: Not at all  Social Connections: Socially Integrated (10/14/2023)   Social Connection and Isolation Panel    Frequency of Communication with Friends and Family: More than three times a week    Frequency of Social Gatherings with Friends and Family: Three times a week    Attends Religious Services: More than 4 times per year    Active Member of Clubs or Organizations: Yes    Attends Banker Meetings: More than 4 times per year    Marital Status: Married  Catering manager Violence: Not At Risk (10/14/2023)   Humiliation, Afraid, Rape, and Kick questionnaire    Fear of Current or Ex-Partner: No    Emotionally Abused: No    Physically Abused: No    Sexually Abused: No    Review of Systems  All other systems reviewed and are negative. 13 point review of systems per patient health survey noted.  Negative other than as indicated above or in HPI. Chronic nocturia 3 times per night, no recent changes, no hematuria, declines treatment or further workup at this time.  Objective:   Vitals:   10/20/23 0806  BP: 136/70  Pulse: 64  Temp: 98.5 F (36.9 C)  SpO2: 95%  Weight: 170 lb (77.1 kg)  Height: 5' 11 (1.803 m)     Physical Exam Vitals reviewed.  Constitutional:      Appearance: He is well-developed.  HENT:     Head: Normocephalic and  atraumatic.     Right Ear: External ear normal.     Left Ear: External ear normal.  Eyes:     Conjunctiva/sclera: Conjunctivae normal.     Pupils: Pupils are equal, round, and reactive to light.  Neck:     Thyroid : No thyromegaly.  Cardiovascular:     Rate and Rhythm: Normal rate and regular rhythm.     Heart sounds: Normal heart sounds.  Pulmonary:     Effort: Pulmonary effort is normal. No respiratory distress.     Breath sounds: Normal breath sounds. No wheezing.  Abdominal:     General: There is no distension.     Palpations: Abdomen is soft.     Tenderness: There is no abdominal tenderness.  Musculoskeletal:        General: No tenderness. Normal range of motion.     Cervical back: Normal range of motion and neck supple.  Lymphadenopathy:     Cervical: No cervical adenopathy.  Skin:    General: Skin is warm and dry.  Neurological:     Mental Status: He is alert and oriented to person, place, and time.     Deep Tendon Reflexes: Reflexes are normal and symmetric.  Psychiatric:        Behavior: Behavior normal.        Assessment & Plan:  Jesse Riddle is a 78 y.o. male . Annual physical exam  --anticipatory guidance as below in AVS, screening labs above. Health maintenance items as above in HPI discussed/recommended as applicable.   Hyperlipidemia, unspecified hyperlipidemia type - Plan: Comp Met (CMET), Lipid panel, atorvastatin  (LIPITOR) 10 MG tablet  -  Stable, tolerating current regimen. Medications refilled. Labs pending as above.   Rash of face - Plan: metroNIDAZOLE  (METROGEL ) 1 % gel Rosacea, mild, intermittent need for metronidazole , has been stable.  Current use of aspirin  - Plan: CBC with Differential/Platelet  - Check CBC for monitoring of platelets,  denies bleeding, risks versus benefits of aspirin  have been discussed as above.  Elevated hemoglobin A1c - Plan: Hemoglobin A1c Prediabetes level prior, stable weight, very active, check A1c and adjust plan  accordingly.  Meds ordered this encounter  Medications   atorvastatin  (LIPITOR) 10 MG tablet    Sig: Take 1 tablet by mouth once daily at 6pm    Dispense:  90 tablet    Refill:  3    Ok to place on hold if needed.   metroNIDAZOLE  (METROGEL ) 1 % gel    Sig: Apply topically to effected facial skin daily, approximately a dime sized amount for coverage    Dispense:  60 g    Refill:  2   Patient Instructions  Thank you for coming in today. No change in medications at this time. If there are any concerns on your bloodwork, I will let you know. Take care!  Preventive Care 23 Years and Older, Male Preventive care refers to lifestyle choices and visits with your health care provider that can promote health and wellness. Preventive care visits are also called wellness exams. What can I expect for my preventive care visit? Counseling During your preventive care visit, your health care provider may ask about your: Medical history, including: Past medical problems. Family medical history. History of falls. Current health, including: Emotional well-being. Home life and relationship well-being. Sexual activity. Memory and ability to understand (cognition). Lifestyle, including: Alcohol, nicotine or tobacco, and drug use. Access to firearms. Diet, exercise, and sleep habits. Work and work Astronomer. Sunscreen use. Safety issues such as seatbelt and bike helmet use. Physical exam Your health care provider will check your: Height and weight. These may be used to calculate your BMI (body mass index). BMI is a measurement that tells if you are at a healthy weight. Waist circumference. This measures the distance around your waistline. This measurement also tells if you are at a healthy weight and may help predict your risk of certain diseases, such as type 2 diabetes and high blood pressure. Heart rate and blood pressure. Body temperature. Skin for abnormal spots. What immunizations do I  need?  Vaccines are usually given at various ages, according to a schedule. Your health care provider will recommend vaccines for you based on your age, medical history, and lifestyle or other factors, such as travel or where you work. What tests do I need? Screening Your health care provider may recommend screening tests for certain conditions. This may include: Lipid and cholesterol levels. Diabetes screening. This is done by checking your blood sugar (glucose) after you have not eaten for a while (fasting). Hepatitis C test. Hepatitis B test. HIV (human immunodeficiency virus) test. STI (sexually transmitted infection) testing, if you are at risk. Lung cancer screening. Colorectal cancer screening. Prostate cancer screening. Abdominal aortic aneurysm (AAA) screening. You may need this if you are a current or former smoker. Talk with your health care provider about your test results, treatment options, and if necessary, the need for more tests. Follow these instructions at home: Eating and drinking  Eat a diet that includes fresh fruits and vegetables, whole grains, lean protein, and low-fat dairy products. Limit your intake of foods with high amounts of sugar, saturated fats, and salt. Take vitamin and mineral supplements as recommended by your health care provider. Do not drink alcohol if your health care provider tells you not to drink. If you drink alcohol: Limit how much you have to 0-2 drinks a day. Know how much alcohol  is in your drink. In the [place] one drink equals one 12 oz bottle of beer (355 mL), one 5 oz glass of wine (148 mL), or one 1 oz glass of hard liquor (44 mL). Lifestyle Brush your teeth every morning and night with fluoride toothpaste. Floss one time each day. Exercise for at least 30 minutes 5 or more days each week. Do not use any products that contain nicotine or tobacco. These products include cigarettes, chewing tobacco, and vaping devices, such as  e-cigarettes. If you need help quitting, ask your health care provider. Do not use drugs. If you are sexually active, practice safe sex. Use a condom or other form of protection to prevent STIs. Take aspirin  only as told by your health care provider. Make sure that you understand how much to take and what form to take. Work with your health care provider to find out whether it is safe and beneficial for you to take aspirin  daily. Ask your health care provider if you need to take a cholesterol-lowering medicine (statin). Find healthy ways to manage stress, such as: Meditation, yoga, or listening to music. Journaling. Talking to a trusted person. Spending time with friends and family. Safety Always wear your seat belt while driving or riding in a vehicle. Do not drive: If you have been drinking alcohol. Do not ride with someone who has been drinking. When you are tired or distracted. While texting. If you have been using any mind-altering substances or drugs. Wear a helmet and other protective equipment during sports activities. If you have firearms in your house, make sure you follow all gun safety procedures. Minimize exposure to UV radiation to reduce your risk of skin cancer. What's next? Visit your health care provider once a year for an annual wellness visit. Ask your health care provider how often you should have your eyes and teeth checked. Stay up to date on all vaccines. This information is not intended to replace advice given to you by your health care provider. Make sure you discuss any questions you have with your health care provider. Document Revised: 09/13/2020 Document Reviewed: 09/13/2020 Elsevier Patient Education  2024 Elsevier Inc.    Signed,   Reyes Pines, MD [facility], [facility] 10/20/23 8:24 AM

## 2023-10-21 ENCOUNTER — Ambulatory Visit: Payer: Self-pay | Admitting: Family Medicine

## 2023-10-29 DIAGNOSIS — L820 Inflamed seborrheic keratosis: Secondary | ICD-10-CM | POA: Diagnosis not present

## 2023-10-29 DIAGNOSIS — L718 Other rosacea: Secondary | ICD-10-CM | POA: Diagnosis not present

## 2024-01-13 ENCOUNTER — Ambulatory Visit: Payer: Self-pay

## 2024-01-13 NOTE — Telephone Encounter (Signed)
 FYI Only or Action Required?: FYI only for provider.  Patient was last seen in primary care on 10/20/2023 by Levora Reyes SAUNDERS, MD.  Called Nurse Triage reporting Cough.  Symptoms began several weeks ago.  Interventions attempted: OTC medications: antihistamine and cough drops.  Symptoms are: unchanged.  Triage Disposition: See PCP When Office is Open (Within 3 Days)  Patient/caregiver understands and will follow disposition?: Yes  Reason for Disposition . [1] Nasal discharge AND [2] present > 10 days  Answer Assessment - Initial Assessment Questions Is taking antihistamine and using cough drops to manage. Patient denies higher acuity questions. ED precautions reviewed, pt verbalized understanding.  This RN confirmed no known contraindications and pt was advised that he can continue the antihistamine and also take mucinex. Encouraged fluid intake. Scheduled next day OV.   1. ONSET: When did the cough begin?      2 weeks  2. SEVERITY: How bad is the cough today?      Moderate to severe  3. SPUTUM: Describe the color of your sputum (e.g., none, dry cough; clear, white, yellow, green)     Clear or green  4. HEMOPTYSIS: Are you coughing up any blood? If Yes, ask: How much? (e.g., flecks, streaks, tablespoons, etc.)     Denies  5. DIFFICULTY BREATHING: Are you having difficulty breathing? If Yes, ask: How bad is it? (e.g., mild, moderate, severe)      Denies, able to do yard work  6. FEVER: Do you have a fever? If Yes, ask: What is your temperature, how was it measured, and when did it start?     Denies  7. CARDIAC HISTORY: Do you have any history of heart disease? (e.g., heart attack, congestive heart failure)      Denies  8. LUNG HISTORY: Do you have any history of lung disease?  (e.g., pulmonary embolus, asthma, emphysema)     Denies  9. PE RISK FACTORS: Do you have a history of blood clots? (or: recent major surgery, recent prolonged travel,  bedridden)     Denies  10. OTHER SYMPTOMS: Do you have any other symptoms? (e.g., runny nose, wheezing, chest pain)       Nasal congestion  Protocols used: Cough - Acute Productive-A-AH   Message from [place] A sent at 01/13/2024 10:59 AM EDT  Reason for Triage: Patient has been sick for two weeks and said that It is usual for him not to be better by now Patient is coughing with production-no body aches or fever

## 2024-01-14 ENCOUNTER — Encounter: Payer: Self-pay | Admitting: Family Medicine

## 2024-01-14 ENCOUNTER — Ambulatory Visit: Admitting: Family Medicine

## 2024-01-14 VITALS — BP 126/74 | HR 72 | Temp 98.3°F | Resp 17 | Ht 71.0 in | Wt 172.0 lb

## 2024-01-14 DIAGNOSIS — R059 Cough, unspecified: Secondary | ICD-10-CM | POA: Diagnosis not present

## 2024-01-14 DIAGNOSIS — R0981 Nasal congestion: Secondary | ICD-10-CM | POA: Diagnosis not present

## 2024-01-14 MED ORDER — AZITHROMYCIN 250 MG PO TABS: ORAL_TABLET | ORAL | 0 refills | Status: AC

## 2024-01-14 NOTE — Progress Notes (Signed)
 Subjective:  Patient ID: Jesse Riddle, male    DOB: 06-Mar-1946  Age: 78 y.o. MRN: [id number]  CC:  Chief Complaint  Patient presents with   Cough    Head congestion. No fever or chills. A lot of mucus coming up. Was dark in color and now its lightened up. Sx started 15 days ago. Getting better.     HPI Jesse Riddle presents for   Cough, sinus congestion Initial symptoms started about 2 weeks ago - 15 days ago.   Initially had increased mucus with some discoloration, but that has started to lighten up. Feeling better today, less cough, less mucus production. No fever or dyspnea. No known sick contacts. No covid testing/flu testing.  Has not felt bad - able to rake leaves, active. No insomnia or need for cough suppressant.     Tx: cough drops, antihistamine.    History Patient Active Problem List   Diagnosis Date Noted   OA (osteoarthritis) of hip 05/02/2021   S/P total left hip arthroplasty 05/02/2021   Past Medical History:  Diagnosis Date   Allergy    penicillin   History of kidney stones    Past Surgical History:  Procedure Laterality Date   COLONOSCOPY     FINGER SURGERY     JOINT REPLACEMENT  05/02/2021   TONSILLECTOMY     TOTAL HIP ARTHROPLASTY Left 05/02/2021   Procedure: TOTAL HIP ARTHROPLASTY ANTERIOR APPROACH;  Surgeon: Melodi Lerner, MD;  Location: [facility];  Service: Orthopedics;  Laterality: Left;   Allergies  Allergen Reactions   Penicillins Other (See Comments)    Child Tolerated Cephalosporin Date: 05/03/21.     Prior to Admission medications   Medication Sig Start Date End Date Taking? Authorizing Provider  atorvastatin  (LIPITOR) 10 MG tablet Take 1 tablet by mouth once daily at 6pm 10/20/23  Yes Levora Reyes SAUNDERS, MD  Coenzyme Q10 (CO Q-10 PO) Take 1 capsule by mouth daily.   Yes [provider]  fish oil-omega-3 fatty acids 1000 MG capsule Take 1 g by mouth daily.   Yes [provider]  Turmeric (QC TUMERIC COMPLEX PO)  Take 1 capsule by mouth daily.   Yes [provider]  metroNIDAZOLE  (METROGEL ) 1 % gel Apply topically to effected facial skin daily, approximately a dime sized amount for coverage Patient not taking: Reported on 01/14/2024 10/20/23   Levora Reyes SAUNDERS, MD   Social History   Socioeconomic History   Marital status: Married    Spouse name: Not on file   Number of children: 3   Years of education: Not on file   Highest education level: Bachelor's degree (e.g., BA, AB, BS)  Occupational History   Occupation: Retired    Associate Professor: Sales promotion account executive  Tobacco Use   Smoking status: Never   Smokeless tobacco: Never  Vaping Use   Vaping status: Never Used  Substance and Sexual Activity   Alcohol use: Yes    Alcohol/week: 1.0 standard drink of alcohol    Types: 1 Cans of beer per week    Comment: Occasional   Drug use: No   Sexual activity: Yes  Other Topics Concern   Not on file  Social History Narrative   Married. Education: [employer]. Exercise: walk/bike 3 times a week for 30 minutes.   Social Drivers of Corporate investment banker Strain: Low Risk  (10/14/2023)   Overall Financial Resource Strain (CARDIA)    Difficulty of Paying Living Expenses: Not hard at all  Food Insecurity: No Food Insecurity (10/14/2023)   Hunger Vital Sign    Worried About Running Out of Food in the Last Year: Never true    Ran Out of Food in the Last Year: Never true  Transportation Needs: No Transportation Needs (01/13/2024)   PRAPARE - Administrator, Civil Service (Medical): No    Lack of Transportation (Non-Medical): No  Physical Activity: Sufficiently Active (10/14/2023)   Exercise Vital Sign    Days of Exercise per Week: 4 days    Minutes of Exercise per Session: 40 min  Stress: No Stress Concern Present (01/13/2024)   [employer] of Occupational Health - Occupational Stress Questionnaire    Feeling of Stress: Not at all  Social Connections: Socially Integrated (01/13/2024)    Social Connection and Isolation Panel    Frequency of Communication with Friends and Family: More than three times a week    Frequency of Social Gatherings with Friends and Family: More than three times a week    Attends Religious Services: More than 4 times per year    Active Member of [employer] or Organizations: Yes    Attends Banker Meetings: 1 to 4 times per year    Marital Status: Married  Catering manager Violence: Not At Risk (10/14/2023)   Humiliation, Afraid, Rape, and Kick questionnaire    Fear of Current or Ex-Partner: No    Emotionally Abused: No    Physically Abused: No    Sexually Abused: No    Review of Systems  Per hPI Objective:   Vitals:   01/14/24 1147  BP: 126/74  Pulse: 72  Resp: 17  Temp: 98.3 F (36.8 C)  TempSrc: Temporal  SpO2: 97%  Weight: 172 lb (78 kg)  Height: 5' 11 (1.803 m)     Physical Exam Vitals reviewed.  Constitutional:      Appearance: He is well-developed.  HENT:     Head: Normocephalic and atraumatic.     Right Ear: Tympanic membrane, ear canal and external ear normal.     Left Ear: Tympanic membrane, ear canal and external ear normal.     Nose: No rhinorrhea.     Comments: No focal sinus tenderness with percussion.    Mouth/Throat:     Pharynx: No oropharyngeal exudate or posterior oropharyngeal erythema.  Eyes:     Conjunctiva/sclera: Conjunctivae normal.     Pupils: Pupils are equal, round, and reactive to light.  Cardiovascular:     Rate and Rhythm: Normal rate and regular rhythm.     Heart sounds: Normal heart sounds. No murmur heard. Pulmonary:     Effort: Pulmonary effort is normal.     Breath sounds: Normal breath sounds. No wheezing, rhonchi or rales.  Abdominal:     Palpations: Abdomen is soft.     Tenderness: There is no abdominal tenderness.  Musculoskeletal:     Cervical back: Neck supple.  Lymphadenopathy:     Cervical: No cervical adenopathy.  Skin:    General: Skin is warm and dry.      Findings: No rash.  Neurological:     Mental Status: He is alert and oriented to person, place, and time.  Psychiatric:        Behavior: Behavior normal.        Assessment & Plan:  Jesse Riddle is a 78 y.o. male . Cough, unspecified type - Plan: azithromycin (ZITHROMAX) 250 MG tablet  Sinus congestion - Plan: azithromycin (ZITHROMAX) 250 MG tablet 2-week  history of discolored nasal discharge, cough, productive cough as above with some slight improvement today.  Viral versus early sinus infection that is starting to improve.  Reassuring exam.  Continue symptomatic care at this time, antibiotic was printed if not continuing to improve to fill if needed in the next few days with RTC precautions given.  Meds ordered this encounter  Medications   azithromycin (ZITHROMAX) 250 MG tablet    Sig: Take 2 tablets on day 1, then 1 tablet daily on days 2 through 5    Dispense:  6 tablet    Refill:  0   Patient Instructions  Glad to hear things are turning the corner today.  I suspect you could have had a virus or early sinus infection that is starting to improve.  As long as you are improving I think it would be reasonable to continue cough drops, Mucinex over-the-counter, fluids, and rest as needed.  If not continuing to improve or worsening congestion, can fill the antibiotic azithromycin.  Any progressive symptoms such as shortness of breath, fevers or acute worsening please be seen but I do not expect that to occur.  Hope you continue to feel better and take care!    Signed,   Reyes Pines, MD [facility] 01/14/24 12:16 PM

## 2024-01-14 NOTE — Patient Instructions (Signed)
 Glad to hear things are turning the corner today.  I suspect you could have had a virus or early sinus infection that is starting to improve.  As long as you are improving I think it would be reasonable to continue cough drops, Mucinex over-the-counter, fluids, and rest as needed.  If not continuing to improve or worsening congestion, can fill the antibiotic azithromycin.  Any progressive symptoms such as shortness of breath, fevers or acute worsening please be seen but I do not expect that to occur.  Hope you continue to feel better and take care!

## 2024-03-08 ENCOUNTER — Ambulatory Visit: Payer: Self-pay

## 2024-03-08 ENCOUNTER — Ambulatory Visit: Admitting: Student in an Organized Health Care Education/Training Program

## 2024-03-08 ENCOUNTER — Encounter: Payer: Self-pay | Admitting: Student in an Organized Health Care Education/Training Program

## 2024-03-08 VITALS — BP 144/72 | HR 78 | Temp 97.6°F | Wt 171.0 lb

## 2024-03-08 DIAGNOSIS — J069 Acute upper respiratory infection, unspecified: Secondary | ICD-10-CM | POA: Insufficient documentation

## 2024-03-08 LAB — POC COVID19 BINAXNOW: SARS Coronavirus 2 Ag: NEGATIVE

## 2024-03-08 LAB — POCT INFLUENZA A/B
Influenza A, POC: NEGATIVE
Influenza B, POC: NEGATIVE

## 2024-03-08 MED ORDER — BENZONATATE 200 MG PO CAPS: 200.0000 mg | ORAL_CAPSULE | Freq: Two times a day (BID) | ORAL | 0 refills | Status: AC | PRN

## 2024-03-08 MED ORDER — GUAIFENESIN-CODEINE 100-10 MG/5ML PO SOLN: 5.0000 mL | Freq: Every evening | ORAL | 0 refills | Status: AC | PRN

## 2024-03-08 NOTE — Patient Instructions (Signed)
  VISIT SUMMARY: You came in today with a severe cough and congestion that started after a cold you had a few weeks ago. You have been experiencing significant phlegm production, muscle soreness from coughing, and a very sore throat. You have been taking Mucinex , gargling with salt water  and Listerine, and drinking plenty of fluids. You have no fever but have had a mild headache for which you have been taking ibuprofen. Your COVID-19 and influenza tests are negative.  YOUR PLAN: -ACUTE UPPER RESPIRATORY INFECTION: You have an acute upper respiratory infection, which is likely due to a viral infection. This means that your symptoms of congestion, cough, sore throat, and phlegm are caused by a virus. Your cough may persist for 2-4 weeks. I have prescribed a codeine -based cough syrup for nighttime use and Tessalon  for daytime cough. Please discontinue Mucinex . Monitor your symptoms and report any worsening. If your symptoms worsen, we may consider prescribing Augmentin, an antibiotic.  INSTRUCTIONS: Please use the prescribed codeine -based cough syrup at night and Tessalon  during the day for your cough. Discontinue Mucinex . Monitor your symptoms and report any worsening. If your symptoms worsen, we may consider prescribing Augmentin. Follow up if your symptoms do not improve or if they worsen.

## 2024-03-08 NOTE — Progress Notes (Signed)
 Acute Office Visit  Patient ID: KASHDEN DEBOY, male    DOB: [birth year], 78 y.o.   MRN: [id number]  PCP: Levora Reyes SAUNDERS, MD  Chief Complaint  Patient presents with   Cough    Triage note please read     Subjective:     HPI  Discussed the use of AI scribe software for clinical note transcription with the patient, who gave verbal consent to proceed.  History of Present Illness ERMINE SPOFFORD is a 78 year old male who presents with a severe cough and congestion.  Approximately four to five weeks ago, he experienced a cold lasting fifteen days. He seldom gets colds, with the last one occurring three or four years ago. During that episode, he was advised to use an antibiotic prescription if necessary, but he recovered without it within a couple of days.  This past Friday morning, he woke up with congestion and has been experiencing a severe cough throughout the weekend, described as worse than ever. The cough has caused muscle soreness and significant phlegm production. He started taking Mucinex  yesterday, with doses in the afternoon and Monday morning. He has also been gargling with salt water  and Listerine, and drinking plenty of fluids, including teas and juices.  He reports no fever but has experienced a mild headache for which he has been taking ibuprofen. He feels congested in his sinuses, particularly at night, with one side getting clogged. He has a very sore throat from coughing, which sometimes makes it difficult to catch his breath. No shortness of breath during walking, ear pain, or pain down his throat. He is eating and drinking well, with no abdominal pain or diarrhea.  He typically works out three times a week and is generally healthy. His wife, who is in excellent health, started coughing some last night, but not as severely as him. He attended a church dinner last Wednesday where several people were coughing.      Objective:    BP (!) 144/72   Pulse 78   Temp 97.6  F (36.4 C) (Oral)   Wt 171 lb (77.6 kg)   SpO2 98%   BMI 23.85 kg/m   Physical Exam  Gen: Well-appearing older man, minimal fragility Ears: Bilateral tympanic membranes look normal with no erythema or middle ear effusion Mouth: Mild posterior oropharynx erythema, no exudate Neck: No tender adenopathy Heart: Regular, no murmur Lungs: Unlabored, no wheezing, mostly clear except very mild end inspiratory crackles at the right base   Results for orders placed or performed in visit on 03/08/24  POCT Influenza A/B  Result Value Ref Range   Influenza A, POC Negative Negative   Influenza B, POC Negative Negative  POC COVID-19 BinaxNow  Result Value Ref Range   SARS Coronavirus 2 Ag Negative Negative       Assessment & Plan:   Problem List Items Addressed This Visit       Unprioritized   URTI (acute upper respiratory infection) - Primary   Symptoms include congestion, cough, sore throat, and phlegm, likely due to a viral infection. COVID-19 and influenza tests are negative.  I think this is most likely a viral upper respiratory tract infection.  Given the small crackles at the right base and his advanced age, he is at risk for progression to pneumonia.  I do not think he has commune acquired pneumonia right now.  He looks really good, and has minimal systemic symptoms.  Nonproductive cough is most bothersome to him,  we talked about reasonable expectations and that cough may persist for 2-4 weeks. Prescribed codeine -based cough syrup for nighttime use and Tessalon  for daytime cough. Discontinue Mucinex . Monitor and report any worsening symptoms.  He should call me or message me if he has worsening symptoms, and I will prescribe Augmentin for CAP.      Relevant Medications   guaiFENesin -codeine  100-10 MG/5ML syrup   benzonatate  (TESSALON ) 200 MG capsule   Other Relevant Orders   POCT Influenza A/B (Completed)   POC COVID-19 BinaxNow (Completed)     Meds ordered this encounter   Medications   guaiFENesin -codeine  100-10 MG/5ML syrup    Sig: Take 5 mLs by mouth at bedtime as needed.    Dispense:  120 mL    Refill:  0   benzonatate  (TESSALON ) 200 MG capsule    Sig: Take 1 capsule (200 mg total) by mouth 2 (two) times daily as needed for cough.    Dispense:  20 capsule    Refill:  0    Return if symptoms worsen or fail to improve.  Cleatus Debby Specking, MD [facility]

## 2024-03-08 NOTE — Telephone Encounter (Signed)
 FYI Only or Action Required?: FYI only for provider: appointment scheduled on 12.8.25.  Patient was last seen in primary care on 01/14/2024 by Jesse Reyes SAUNDERS, MD.  Called Nurse Triage reporting Cough.  Symptoms began several days ago.  Interventions attempted: OTC medications: mucinex , cough drops and Rest, hydration, or home remedies.  Symptoms are: gradually worsening.  Triage Disposition: See Physician Within 24 Hours  Patient/caregiver understands and will follow disposition?:    Copied from CRM #[id number]. Topic: Clinical - Red Word Triage >> Mar 08, 2024 12:50 PM Rosina BIRCH wrote: Red Word that prompted transfer to Nurse Triage: coughing up green mucus but some is clear, sore throat and his stomach is hurting from the coughing. Patient has taken ibuprofen, mucinex  and gargled with salt water  and Listerine Reason for Disposition  [1] Continuous (nonstop) coughing interferes with work or school AND [2] no improvement using cough treatment per Care Advice  Answer Assessment - Initial Assessment Questions Had a cough x 15 days a couple weeks ago (rn reviewed chart it was in October). Pt was given a script for atbx if he didn't get better. He states he did start to feel better. He was fine until last week. He states he began to have thick mucus, heavy coughing, sore throat, headache. He states he coughs so hard and deep the muscles in his back, stomach and chest ache after coughing. He denies pain at rest. Denies shortness of breath. Does also have nasal congestion. States mucus he is coughing out is sometimes clear, sometimes green. Denies fever.    1. ONSET: When did the cough begin?      Friday 2. SEVERITY: How bad is the cough today?      moderate 3. SPUTUM: Describe the color of your sputum (e.g., none, dry cough; clear, white, yellow, green)     Clear and green 4. HEMOPTYSIS: Are you coughing up any blood? If Yes, ask: How much? (e.g., flecks, streaks, tablespoons,  etc.)     denies 5. DIFFICULTY BREATHING: Are you having difficulty breathing? If Yes, ask: How bad is it? (e.g., mild, moderate, severe)      denies 6. FEVER: Do you have a fever? If Yes, ask: What is your temperature, how was it measured, and when did it start?     denies 7. CARDIAC HISTORY: Do you have any history of heart disease? (e.g., heart attack, congestive heart failure)      no 8. LUNG HISTORY: Do you have any history of lung disease?  (e.g., pulmonary embolus, asthma, emphysema)     no 9. OTHER SYMPTOMS: Do you have any other symptoms? (e.g., runny nose, wheezing, chest pain)       Nasal congestion, headache  Protocols used: Cough - Acute Productive-A-AH

## 2024-03-08 NOTE — Assessment & Plan Note (Signed)
 Symptoms include congestion, cough, sore throat, and phlegm, likely due to a viral infection. COVID-19 and influenza tests are negative.  I think this is most likely a viral upper respiratory tract infection.  Given the small crackles at the right base and his advanced age, he is at risk for progression to pneumonia.  I do not think he has commune acquired pneumonia right now.  He looks really good, and has minimal systemic symptoms.  Nonproductive cough is most bothersome to him, we talked about reasonable expectations and that cough may persist for 2-4 weeks. Prescribed codeine -based cough syrup for nighttime use and Tessalon  for daytime cough. Discontinue Mucinex . Monitor and report any worsening symptoms.  He should call me or message me if he has worsening symptoms, and I will prescribe Augmentin for CAP.

## 2024-10-19 ENCOUNTER — Ambulatory Visit

## 2024-10-21 ENCOUNTER — Encounter: Admitting: Family Medicine
